# Patient Record
Sex: Male | Born: 1985 | Race: Black or African American | Hispanic: No | Marital: Single | Smoking: Current every day smoker
Health system: Southern US, Community
[De-identification: ages and names within clinical notes are randomized; demographics above are authoritative.]

## PROBLEM LIST (undated history)

## (undated) DIAGNOSIS — J45909 Unspecified asthma, uncomplicated: Secondary | ICD-10-CM

## (undated) DIAGNOSIS — W3400XA Accidental discharge from unspecified firearms or gun, initial encounter: Secondary | ICD-10-CM

## (undated) HISTORY — PX: OTHER SURGICAL HISTORY: SHX169

## (undated) HISTORY — PX: ABDOMINAL SURGERY: SHX537

---

## 2014-12-09 DIAGNOSIS — R1110 Vomiting, unspecified: Secondary | ICD-10-CM | POA: Diagnosis not present

## 2014-12-09 DIAGNOSIS — K029 Dental caries, unspecified: Secondary | ICD-10-CM | POA: Diagnosis not present

## 2015-01-22 DIAGNOSIS — K0510 Chronic gingivitis, plaque induced: Secondary | ICD-10-CM | POA: Diagnosis not present

## 2015-01-22 DIAGNOSIS — K088 Other specified disorders of teeth and supporting structures: Secondary | ICD-10-CM | POA: Diagnosis not present

## 2015-01-22 DIAGNOSIS — Z717 Human immunodeficiency virus [HIV] counseling: Secondary | ICD-10-CM | POA: Diagnosis not present

## 2015-04-11 DIAGNOSIS — Z202 Contact with and (suspected) exposure to infections with a predominantly sexual mode of transmission: Secondary | ICD-10-CM | POA: Diagnosis not present

## 2015-04-11 DIAGNOSIS — L209 Atopic dermatitis, unspecified: Secondary | ICD-10-CM | POA: Diagnosis not present

## 2015-04-11 DIAGNOSIS — Z113 Encounter for screening for infections with a predominantly sexual mode of transmission: Secondary | ICD-10-CM | POA: Diagnosis not present

## 2015-06-03 DIAGNOSIS — J45901 Unspecified asthma with (acute) exacerbation: Secondary | ICD-10-CM | POA: Diagnosis not present

## 2015-09-13 DIAGNOSIS — F17210 Nicotine dependence, cigarettes, uncomplicated: Secondary | ICD-10-CM | POA: Diagnosis not present

## 2015-09-13 DIAGNOSIS — J45909 Unspecified asthma, uncomplicated: Secondary | ICD-10-CM | POA: Diagnosis not present

## 2015-09-13 DIAGNOSIS — J40 Bronchitis, not specified as acute or chronic: Secondary | ICD-10-CM | POA: Diagnosis not present

## 2015-10-07 DIAGNOSIS — R0989 Other specified symptoms and signs involving the circulatory and respiratory systems: Secondary | ICD-10-CM | POA: Diagnosis not present

## 2015-10-07 DIAGNOSIS — M4806 Spinal stenosis, lumbar region: Secondary | ICD-10-CM | POA: Diagnosis not present

## 2015-10-07 DIAGNOSIS — G451 Carotid artery syndrome (hemispheric): Secondary | ICD-10-CM | POA: Diagnosis not present

## 2015-10-07 DIAGNOSIS — Z79899 Other long term (current) drug therapy: Secondary | ICD-10-CM | POA: Diagnosis not present

## 2015-10-07 DIAGNOSIS — H81319 Aural vertigo, unspecified ear: Secondary | ICD-10-CM | POA: Diagnosis not present

## 2015-10-07 DIAGNOSIS — R0789 Other chest pain: Secondary | ICD-10-CM | POA: Diagnosis not present

## 2015-10-07 DIAGNOSIS — I739 Peripheral vascular disease, unspecified: Secondary | ICD-10-CM | POA: Diagnosis not present

## 2015-10-07 DIAGNOSIS — R1013 Epigastric pain: Secondary | ICD-10-CM | POA: Diagnosis not present

## 2015-10-07 DIAGNOSIS — R109 Unspecified abdominal pain: Secondary | ICD-10-CM | POA: Diagnosis not present

## 2015-10-07 DIAGNOSIS — R75 Inconclusive laboratory evidence of human immunodeficiency virus [HIV]: Secondary | ICD-10-CM | POA: Diagnosis not present

## 2015-12-20 DIAGNOSIS — R05 Cough: Secondary | ICD-10-CM | POA: Diagnosis not present

## 2015-12-20 DIAGNOSIS — Z885 Allergy status to narcotic agent status: Secondary | ICD-10-CM | POA: Diagnosis not present

## 2015-12-20 DIAGNOSIS — R509 Fever, unspecified: Secondary | ICD-10-CM | POA: Diagnosis not present

## 2015-12-20 DIAGNOSIS — J069 Acute upper respiratory infection, unspecified: Secondary | ICD-10-CM | POA: Diagnosis not present

## 2015-12-20 DIAGNOSIS — Z888 Allergy status to other drugs, medicaments and biological substances status: Secondary | ICD-10-CM | POA: Diagnosis not present

## 2015-12-20 DIAGNOSIS — J45901 Unspecified asthma with (acute) exacerbation: Secondary | ICD-10-CM | POA: Diagnosis not present

## 2016-05-22 DIAGNOSIS — G8929 Other chronic pain: Secondary | ICD-10-CM | POA: Diagnosis not present

## 2016-05-22 DIAGNOSIS — M779 Enthesopathy, unspecified: Secondary | ICD-10-CM | POA: Diagnosis not present

## 2016-05-22 DIAGNOSIS — M549 Dorsalgia, unspecified: Secondary | ICD-10-CM | POA: Diagnosis not present

## 2016-05-22 DIAGNOSIS — J45909 Unspecified asthma, uncomplicated: Secondary | ICD-10-CM | POA: Diagnosis not present

## 2016-05-22 DIAGNOSIS — F17210 Nicotine dependence, cigarettes, uncomplicated: Secondary | ICD-10-CM | POA: Diagnosis not present

## 2016-06-18 DIAGNOSIS — L851 Acquired keratosis [keratoderma] palmaris et plantaris: Secondary | ICD-10-CM | POA: Diagnosis not present

## 2016-06-18 DIAGNOSIS — M79604 Pain in right leg: Secondary | ICD-10-CM | POA: Diagnosis not present

## 2016-06-18 DIAGNOSIS — B353 Tinea pedis: Secondary | ICD-10-CM | POA: Diagnosis not present

## 2016-06-18 DIAGNOSIS — H01014 Ulcerative blepharitis left upper eyelid: Secondary | ICD-10-CM | POA: Diagnosis not present

## 2016-06-18 DIAGNOSIS — M79605 Pain in left leg: Secondary | ICD-10-CM | POA: Diagnosis not present

## 2016-06-19 DIAGNOSIS — H01014 Ulcerative blepharitis left upper eyelid: Secondary | ICD-10-CM | POA: Diagnosis not present

## 2016-07-14 DIAGNOSIS — M25531 Pain in right wrist: Secondary | ICD-10-CM | POA: Diagnosis not present

## 2016-07-14 DIAGNOSIS — Y9289 Other specified places as the place of occurrence of the external cause: Secondary | ICD-10-CM | POA: Diagnosis not present

## 2016-07-14 DIAGNOSIS — S638X1A Sprain of other part of right wrist and hand, initial encounter: Secondary | ICD-10-CM | POA: Diagnosis not present

## 2016-08-28 ENCOUNTER — Ambulatory Visit (HOSPITAL_COMMUNITY)
Admission: EM | Admit: 2016-08-28 | Discharge: 2016-08-28 | Disposition: A | Discharge status: Home / Self Care | Payer: Medicare Other | Attending: Family Medicine | Admitting: Family Medicine

## 2016-08-28 ENCOUNTER — Encounter (HOSPITAL_COMMUNITY): Payer: Self-pay | Admitting: Family Medicine

## 2016-08-28 DIAGNOSIS — J4520 Mild intermittent asthma, uncomplicated: Secondary | ICD-10-CM

## 2016-08-28 DIAGNOSIS — M542 Cervicalgia: Secondary | ICD-10-CM

## 2016-08-28 DIAGNOSIS — M898X1 Other specified disorders of bone, shoulder: Secondary | ICD-10-CM | POA: Diagnosis not present

## 2016-08-28 MED ORDER — CYCLOBENZAPRINE HCL 10 MG PO TABS: 10.0000 mg | ORAL_TABLET | Freq: Two times a day (BID) | ORAL | 0 refills | Status: DC | PRN

## 2016-08-28 MED ORDER — ALBUTEROL SULFATE HFA 108 (90 BASE) MCG/ACT IN AERS: 2.0000 | INHALATION_SPRAY | RESPIRATORY_TRACT | 0 refills | Status: DC | PRN

## 2016-08-28 MED ORDER — NAPROXEN 500 MG PO TABS: 500.0000 mg | ORAL_TABLET | Freq: Two times a day (BID) | ORAL | 0 refills | Status: DC

## 2016-08-28 NOTE — ED Provider Notes (Signed)
CSN: [id number]     Arrival date & time 08/28/16  1719 History   First MD Initiated Contact with Patient 08/28/16 1831     Chief Complaint  Patient presents with  . Back Pain  . Neck Pain   (Consider location/radiation/quality/duration/timing/severity/associated sxs/prior Treatment) Patient c/o left neck and upper back discomfort. He is having asthma sx's and wants an asthma pump.  He would like to quit smoking and is asking for chantix.   The history is provided by the patient.  Back Pain  Location:  Thoracic spine Quality:  Aching Radiates to:  L shoulder Pain severity:  Moderate Pain is:  Same all the time Onset quality:  Sudden Duration:  2 days Timing:  Constant Progression:  Worsening Chronicity:  New Relieved by:  Nothing Ineffective treatments:  None tried Neck Pain    History reviewed. No pertinent past medical history. History reviewed. No pertinent surgical history. History reviewed. No pertinent family history. Social History  Substance Use Topics  . Smoking status: Never Smoker  . Smokeless tobacco: Never Used  . Alcohol use Not on file    Review of Systems  Constitutional: Negative.   HENT: Negative.   Eyes: Negative.   Respiratory: Negative.   Cardiovascular: Negative.   Gastrointestinal: Negative.   Endocrine: Negative.   Genitourinary: Negative.   Musculoskeletal: Positive for back pain and neck pain.  Skin: Negative.   Allergic/Immunologic: Negative.   Neurological: Negative.   Hematological: Negative.   Psychiatric/Behavioral: Negative.     Allergies  Lyrica [pregabalin]  Home Medications   Prior to Admission medications   Medication Sig Start Date End Date Taking? Authorizing Provider  albuterol (PROVENTIL HFA;VENTOLIN HFA) 108 (90 Base) MCG/ACT inhaler Inhale 2 puffs into the lungs every 4 (four) hours as needed for wheezing or shortness of breath. 08/28/16   Deatra CanterWilliam J Oxford, FNP  cyclobenzaprine (FLEXERIL) 10 MG tablet Take 1  tablet (10 mg total) by mouth 2 (two) times daily as needed for muscle spasms. 08/28/16   Deatra CanterWilliam J Oxford, FNP  naproxen (NAPROSYN) 500 MG tablet Take 1 tablet (500 mg total) by mouth 2 (two) times daily with a meal. 08/28/16   Deatra CanterWilliam J Oxford, FNP   Meds Ordered and Administered this Visit  Medications - No data to display  BP 127/87   Pulse 79   Temp 98.5 F (36.9 C)   Resp 18   SpO2 100%  No data found.   Physical Exam  Constitutional: He appears well-developed and well-nourished.  HENT:  Head: Normocephalic and atraumatic.  Eyes: Conjunctivae and EOM are normal. Pupils are equal, round, and reactive to light.  Neck: Normal range of motion. Neck supple.  Cardiovascular: Normal rate, regular rhythm and normal heart sounds.   Pulmonary/Chest: Effort normal and breath sounds normal.  Abdominal: Soft. Bowel sounds are normal.  Musculoskeletal: He exhibits tenderness.  Tender scapula and thoracic paraspinous muscle  Nursing note and vitals reviewed.   Urgent Care Course   Clinical Course     Procedures (including critical care time)  Labs Review Labs Reviewed - No data to display  Imaging Review No results found.   Visual Acuity Review  Right Eye Distance:   Left Eye Distance:   Bilateral Distance:    Right Eye Near:   Left Eye Near:    Bilateral Near:         MDM   1. Pain of left scapula   2. Neck pain   3. Mild intermittent asthma without complication  Albuterol MDI Naprosyn 500mg  one po bid x 10d Flexeril 10mg  one po bid prn #20     Deatra CanterWilliam J Oxford, FNP 08/28/16 1900
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
## 2016-11-06 ENCOUNTER — Encounter (HOSPITAL_COMMUNITY): Payer: Self-pay | Admitting: Emergency Medicine

## 2016-11-06 ENCOUNTER — Emergency Department (HOSPITAL_COMMUNITY)
Admission: EM | Admit: 2016-11-06 | Discharge: 2016-11-06 | Disposition: A | Discharge status: Home / Self Care | Payer: Medicare Other | Attending: Emergency Medicine | Admitting: Emergency Medicine

## 2016-11-06 ENCOUNTER — Emergency Department (HOSPITAL_COMMUNITY): Payer: Medicare Other

## 2016-11-06 DIAGNOSIS — R0789 Other chest pain: Secondary | ICD-10-CM | POA: Insufficient documentation

## 2016-11-06 DIAGNOSIS — F17200 Nicotine dependence, unspecified, uncomplicated: Secondary | ICD-10-CM | POA: Diagnosis not present

## 2016-11-06 DIAGNOSIS — R079 Chest pain, unspecified: Secondary | ICD-10-CM | POA: Diagnosis not present

## 2016-11-06 LAB — CBC
HEMATOCRIT: 47.3 % (ref 39.0–52.0)
HEMOGLOBIN: 15.9 g/dL (ref 13.0–17.0)
MCH: 29.7 pg (ref 26.0–34.0)
MCHC: 33.6 g/dL (ref 30.0–36.0)
MCV: 88.4 fL (ref 78.0–100.0)
PLATELETS: 260 10*3/uL (ref 150–400)
RBC: 5.35 MIL/uL (ref 4.22–5.81)
RDW: 13.1 % (ref 11.5–15.5)
WBC: 8.8 10*3/uL (ref 4.0–10.5)

## 2016-11-06 LAB — I-STAT TROPONIN, ED: Troponin i, poc: 0 ng/mL (ref 0.00–0.08)

## 2016-11-06 LAB — BASIC METABOLIC PANEL
Anion gap: 8 (ref 5–15)
BUN: 6 mg/dL (ref 6–20)
CHLORIDE: 106 mmol/L (ref 101–111)
CO2: 27 mmol/L (ref 22–32)
CREATININE: 0.86 mg/dL (ref 0.61–1.24)
Calcium: 9.6 mg/dL (ref 8.9–10.3)
GFR calc non Af Amer: >60 mL/min (ref >60)
Glucose, Bld: 86 mg/dL (ref 65–99)
POTASSIUM: 4.2 mmol/L (ref 3.5–5.1)
SODIUM: 141 mmol/L (ref 135–145)

## 2016-11-06 MED ORDER — ALBUTEROL SULFATE HFA 108 (90 BASE) MCG/ACT IN AERS
2.0000 | INHALATION_SPRAY | RESPIRATORY_TRACT | Status: DC | PRN
  Administered 2016-11-06: 2 via RESPIRATORY_TRACT
  Filled 2016-11-06: qty 6.7

## 2016-11-06 MED ORDER — AEROCHAMBER PLUS W/MASK MISC
1.0000 | Freq: Once | Status: AC
  Administered 2016-11-06: 1
  Filled 2016-11-06: qty 1
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
## 2016-11-06 NOTE — Discharge Instructions (Signed)
Take Tylenol as directed for pain. Use your albuterol inhaler with spacer 2 puffs every 4 hours as needed for cough or shortness of breath. Contact Dr. Mayford KnifeWilliams to get a primary care physician. Ask Dr. Mayford KnifeWilliams to help you to stop smoking.

## 2016-11-06 NOTE — Care Management CC44 (Signed)
ED CM met with patient, who reports recently moving from [place] and not having a PCP. CM discuss PCP who are taking new Medicare patients. Dr. York Ram is accepting new Medicare patients. Provided office contact information, patient verbalized understanding and teach back done. No further ED CM needs identified.

## 2016-11-06 NOTE — Care Management (Signed)
CM attempted contacted patient  to inform him that the referral fro Dr. Mayford KnifeWilliams will need to changed due to practice no longer open. Provided him with Dr. Nils Pylesi  Bonsu information, since patient [facility] to [facility] [address] Will make second attempt tomorrow.

## 2016-11-06 NOTE — ED Triage Notes (Signed)
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

## 2016-11-29 ENCOUNTER — Emergency Department (HOSPITAL_COMMUNITY)
Admission: EM | Admit: 2016-11-29 | Discharge: 2016-11-29 | Disposition: A | Discharge status: Home / Self Care | Payer: Medicare Other | Attending: Emergency Medicine | Admitting: Emergency Medicine

## 2016-11-29 ENCOUNTER — Encounter (HOSPITAL_COMMUNITY): Payer: Self-pay

## 2016-11-29 ENCOUNTER — Encounter (HOSPITAL_COMMUNITY): Payer: Self-pay | Admitting: Emergency Medicine

## 2016-11-29 ENCOUNTER — Emergency Department (HOSPITAL_COMMUNITY)
Admission: EM | Admit: 2016-11-29 | Discharge: 2016-11-29 | Disposition: A | Discharge status: Home / Self Care | Payer: Medicare Other | Source: Home / Self Care

## 2016-11-29 DIAGNOSIS — J020 Streptococcal pharyngitis: Secondary | ICD-10-CM

## 2016-11-29 DIAGNOSIS — Z79899 Other long term (current) drug therapy: Secondary | ICD-10-CM | POA: Diagnosis not present

## 2016-11-29 DIAGNOSIS — F17210 Nicotine dependence, cigarettes, uncomplicated: Secondary | ICD-10-CM

## 2016-11-29 DIAGNOSIS — Z5321 Procedure and treatment not carried out due to patient leaving prior to being seen by health care provider: Secondary | ICD-10-CM

## 2016-11-29 DIAGNOSIS — R52 Pain, unspecified: Secondary | ICD-10-CM

## 2016-11-29 DIAGNOSIS — R05 Cough: Secondary | ICD-10-CM

## 2016-11-29 DIAGNOSIS — J029 Acute pharyngitis, unspecified: Secondary | ICD-10-CM | POA: Diagnosis present

## 2016-11-29 LAB — RAPID STREP SCREEN (MED CTR MEBANE ONLY): Streptococcus, Group A Screen (Direct): POSITIVE — AB

## 2016-11-29 MED ORDER — PENICILLIN G BENZATHINE 1200000 UNIT/2ML IM SUSP
1.2000 10*6.[IU] | Freq: Once | INTRAMUSCULAR | Status: AC
  Administered 2016-11-29: 1.2 10*6.[IU] via INTRAMUSCULAR
  Filled 2016-11-29: qty 2

## 2016-11-29 MED ORDER — KETOROLAC TROMETHAMINE 60 MG/2ML IM SOLN
60.0000 mg | Freq: Once | INTRAMUSCULAR | Status: AC
  Administered 2016-11-29: 60 mg via INTRAMUSCULAR
  Filled 2016-11-29: qty 2
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
## 2016-11-29 NOTE — ED Triage Notes (Signed)
Pt to ED from home c/o flu-like symptoms - sore throat, generalized body aches, chills, runny nose, and productive cough since yesterday morning. States he has tried NyQuil without relief. Resp e/u.

## 2016-11-29 NOTE — ED Provider Notes (Signed)
[facility]PT Provider Note   CSN: [id number] Arrival date & time: 11/29/16  0848  By signing my name below, I, Majel Homer, attest that this documentation has been prepared under the direction and in the presence of Josh Nakiya Rallis PA-C . Electronically Signed: Majel Homer, Scribe. 11/29/2016. 9:55 AM.  History   Chief Complaint Chief Complaint  Patient presents with  . Sore Throat   The history is provided by the patient. No language interpreter was used.   HPI Comments: Colin Hammond is a 31 y.o. male who presents to the Emergency Department complaining of gradually worsening, sore throat that began ~2 days ago. Pt reports associated pain when swallowing, cough productive of "thick, yellow" mucous, subjective fever, and chills. He denies any other complaints.   No past medical history on file.  There are no active problems to display for this patient.  Past Surgical History:  Procedure Laterality Date  . ABDOMINAL SURGERY      Home Medications    Prior to Admission medications   Medication Sig Start Date End Date Taking? Authorizing Provider  albuterol (PROVENTIL HFA;VENTOLIN HFA) 108 (90 Base) MCG/ACT inhaler Inhale 2 puffs into the lungs every 4 (four) hours as needed for wheezing or shortness of breath. 08/28/16   Deatra Canter, FNP  cyclobenzaprine (FLEXERIL) 10 MG tablet Take 1 tablet (10 mg total) by mouth 2 (two) times daily as needed for muscle spasms. 08/28/16   Deatra Canter, FNP  naproxen (NAPROSYN) 500 MG tablet Take 1 tablet (500 mg total) by mouth 2 (two) times daily with a meal. 08/28/16   Deatra Canter, FNP    Family History No family history on file.  Social History Social History  Substance Use Topics  . Smoking status: Current Every Day Smoker    Packs/day: 0.50    Types: Cigarettes  . Smokeless tobacco: Never Used  . Alcohol use Not on file   Allergies   Kadian [morphine sulfate er] and Lyrica [pregabalin]  Review of Systems Review of  Systems  Constitutional: Positive for chills and fever. Negative for fatigue.  HENT: Positive for sore throat and trouble swallowing. Negative for congestion, ear pain, rhinorrhea and sinus pressure.   Eyes: Negative for redness.  Respiratory: Positive for cough. Negative for wheezing.   Gastrointestinal: Negative for abdominal pain, diarrhea, nausea and vomiting.  Genitourinary: Negative for dysuria.  Musculoskeletal: Negative for myalgias and neck stiffness.  Skin: Negative for rash.  Neurological: Negative for headaches.  Hematological: Positive for adenopathy.   Physical Exam Updated Vital Signs BP 131/82 (BP Location: Right Arm)   Pulse 85   Temp 98.4 F (36.9 C) (Oral)   Resp 16   Ht 6\' 1"  (1.854 m)   Wt 185 lb (83.9 kg)   SpO2 100%   BMI 24.41 kg/m   Physical Exam  Constitutional: He is oriented to person, place, and time. He appears well-developed and well-nourished.  HENT:  Head: Normocephalic and atraumatic.  Right Ear: Tympanic membrane, external ear and ear canal normal.  Left Ear: Tympanic membrane, external ear and ear canal normal.  Nose: No mucosal edema or rhinorrhea.  Mouth/Throat: Uvula is midline and mucous membranes are normal. Normal dentition. Oropharyngeal exudate, posterior oropharyngeal edema and posterior oropharyngeal erythema present. Tonsillar exudate.  Eyes: Conjunctivae and EOM are normal. Right eye exhibits no discharge. Left eye exhibits no discharge.  Neck: Normal range of motion. Neck supple.  Cardiovascular: Normal rate, regular rhythm and normal heart sounds.   Pulmonary/Chest: Effort  normal and breath sounds normal.  Abdominal: Soft. He exhibits no distension. There is no tenderness.  Musculoskeletal: Normal range of motion.  Lymphadenopathy:    He has cervical adenopathy.       Right cervical: Posterior cervical adenopathy present.       Left cervical: Posterior cervical adenopathy present.  Neurological: He is alert and oriented to  person, place, and time.  Skin: Skin is warm and dry.  Psychiatric: He has a normal mood and affect.  Nursing note and vitals reviewed.  ED Treatments / Results  DIAGNOSTIC STUDIES:  Oxygen Saturation is 100% on RA, normal by my interpretation.    COORDINATION OF CARE:  9:52 AM Discussed treatment plan with pt at bedside and pt agreed to plan.  Labs (all labs ordered are listed, but only abnormal results are displayed) Labs Reviewed  RAPID STREP SCREEN (NOT AT [facility]) - Abnormal; Notable for the following:       Result Value   Streptococcus, Group A Screen (Direct) POSITIVE (*)    All other components within normal limits   EKG  EKG Interpretation None       Radiology No results found.  Procedures Procedures (including critical care time)  Medications Ordered in ED Medications - No data to display  Initial Impression / Assessment and Plan / ED Course  I have reviewed the triage vital signs and the nursing notes.  Pertinent labs & imaging results that were available during my care of the patient were reviewed by me and considered in my medical decision making (see chart for details).     Patient seen and examined. Work-up initiated. Medications ordered.   Vital signs reviewed and are as follows: BP 135/88 (BP Location: Left Arm)   Pulse 75   Temp 98.8 F (37.1 C) (Oral)   Resp 16   Ht 6\' 1"  (1.854 m)   Wt 83.9 kg   SpO2 98%   BMI 24.41 kg/m    Patient informed of positive strep results. IM Toradol and Bicillin given. Counseled on conservative measures.   I personally performed the services described in this documentation, which was scribed in my presence. The recorded information has been reviewed and is accurate.   Final Clinical Impressions(s) / ED Diagnoses   Final diagnoses:  Strep pharyngitis   Strep positive. No abscess noted. Patient tolerating secretions. Treatment as well.    New Prescriptions New Prescriptions   No medications on file      Renne CriglerJoshua Averee Harb, PA-C 11/29/16 1019    Maia PlanJoshua G Long, MD 11/30/16 [phone]

## 2016-11-29 NOTE — Discharge Instructions (Signed)
Please read and follow all provided instructions.  Your diagnoses today include:  1. Strep pharyngitis     Tests performed today include:  Strep test: was POSITIVE for strep throat  Vital signs. See below for your results today.   Medications prescribed:  You were given a one-time shot of penicillin to treat your strep throat.   Take any medications prescribed only as directed.   Home care instructions:  Please read the educational materials provided and follow any instructions contained in this packet.  Follow-up instructions: Please follow-up with your primary care provider as needed for further evaluation of your symptoms.  Return instructions:   Please return to the Emergency Department if you experience worsening symptoms.   Return if you have worsening problems swallowing, your neck becomes swollen, you cannot swallow your saliva or your voice becomes muffled.   Return with high persistent fever, persistent vomiting, or if you have trouble breathing.   Please return if you have any other emergent concerns.  Additional Information:  Your vital signs today were: BP 131/82 (BP Location: Right Arm)    Pulse 85    Temp 99.1 F (37.3 C) (Oral)    Resp 16    Ht 6\' 1"  (1.854 m)    Wt 83.9 kg    SpO2 100%    BMI 24.41 kg/m  If your blood pressure (BP) was elevated above 135/85 this visit, please have this repeated by your doctor within one month. --------------

## 2016-11-29 NOTE — ED Notes (Signed)
Patient comes in with sore throat and swollen tonsils that started yesterday. Patient states he has had strep throat several times in the past.

## 2016-11-29 NOTE — ED Notes (Signed)
Called to take to treatment room no answer

## 2016-11-29 NOTE — ED Triage Notes (Signed)
Per Pt, Pt is coming from home with complaints of cough, sore throat, and generalized body aches that started yesterday. Reports productive cough with yellow, white thick mucous. Denies fevers.

## 2016-12-10 ENCOUNTER — Emergency Department (HOSPITAL_COMMUNITY)
Admission: EM | Admit: 2016-12-10 | Discharge: 2016-12-10 | Disposition: A | Discharge status: Home / Self Care | Payer: Medicare Other | Attending: Emergency Medicine | Admitting: Emergency Medicine

## 2016-12-10 ENCOUNTER — Encounter (HOSPITAL_COMMUNITY): Payer: Self-pay

## 2016-12-10 DIAGNOSIS — F17210 Nicotine dependence, cigarettes, uncomplicated: Secondary | ICD-10-CM | POA: Diagnosis not present

## 2016-12-10 DIAGNOSIS — M533 Sacrococcygeal disorders, not elsewhere classified: Secondary | ICD-10-CM | POA: Insufficient documentation

## 2016-12-10 MED ORDER — IBUPROFEN 800 MG PO TABS: 800.0000 mg | ORAL_TABLET | Freq: Three times a day (TID) | ORAL | 0 refills | Status: DC

## 2016-12-10 MED ORDER — TRAMADOL HCL 50 MG PO TABS: 50.0000 mg | ORAL_TABLET | Freq: Four times a day (QID) | ORAL | 0 refills | Status: DC | PRN

## 2016-12-10 MED ORDER — KETOROLAC TROMETHAMINE 60 MG/2ML IM SOLN
60.0000 mg | Freq: Once | INTRAMUSCULAR | Status: AC
  Administered 2016-12-10: 60 mg via INTRAMUSCULAR
  Filled 2016-12-10: qty 2

## 2016-12-10 NOTE — ED Provider Notes (Signed)
[facility]-EMERGENCY DEPT Provider Note   CSN: [id number] Arrival date & time: 12/10/16  0138  By signing my name below, I, Elder NegusRussell Johnston, attest that this documentation has been prepared under the direction and in the presence of Gilda Creasehristopher J Ruven Corradi, MD. Electronically Signed: Elder Negusussell Johnston, Scribe. 12/10/16. 2:21 AM.   History   Chief Complaint Chief Complaint  Patient presents with  . Tailbone Pain    HPI Colin Hammond is a 31 y.o. male without any chronic medical problems who presents to the ED for evaluation of back pain. This patient states that in the last 48 hours he has experienced worsening pain over the lower midline back at the sacral/gluteal cleft area. He denies any particular trauma, but does state that he works a job with repetitive lifting. No fevers. No decreased sensation or focal weaknesses.   The history is provided by the patient. No language interpreter was used.  Back Pain   This is a new problem. The current episode started 2 days ago. The problem occurs constantly. The problem has been gradually worsening. The pain is associated with no known injury. Pain location: coccyx. The pain is mild. Pertinent negatives include no numbness and no weakness.    History reviewed. No pertinent past medical history.  There are no active problems to display for this patient.   Past Surgical History:  Procedure Laterality Date  . ABDOMINAL SURGERY         Home Medications    Prior to Admission medications   Medication Sig Start Date End Date Taking? Authorizing Provider  albuterol (PROVENTIL HFA;VENTOLIN HFA) 108 (90 Base) MCG/ACT inhaler Inhale 2 puffs into the lungs every 4 (four) hours as needed for wheezing or shortness of breath. 08/28/16   Deatra CanterWilliam J Oxford, FNP  cyclobenzaprine (FLEXERIL) 10 MG tablet Take 1 tablet (10 mg total) by mouth 2 (two) times daily as needed for muscle spasms. 08/28/16   Deatra CanterWilliam J Oxford, FNP  naproxen (NAPROSYN) 500 MG tablet Take  1 tablet (500 mg total) by mouth 2 (two) times daily with a meal. 08/28/16   Deatra CanterWilliam J Oxford, FNP    Family History History reviewed. No pertinent family history.  Social History Social History  Substance Use Topics  . Smoking status: Current Every Day Smoker    Packs/day: 0.50    Types: Cigarettes  . Smokeless tobacco: Never Used  . Alcohol use Not on file     Allergies   Kadian [morphine sulfate er] and Lyrica [pregabalin]   Review of Systems Review of Systems  Musculoskeletal: Positive for back pain.  Neurological: Negative for weakness and numbness.  All other systems reviewed and are negative.    Physical Exam Updated Vital Signs BP (!) 153/93   Pulse 87   Temp 97.9 F (36.6 C) (Oral)   Resp 15   Ht 6\' 1"  (1.854 m)   Wt 192 lb 9 oz (87.3 kg)   SpO2 98%   BMI 25.41 kg/m   Physical Exam  Constitutional: He is oriented to person, place, and time. He appears well-developed and well-nourished. No distress.  HENT:  Head: Normocephalic and atraumatic.  Right Ear: Hearing normal.  Left Ear: Hearing normal.  Nose: Nose normal.  Mouth/Throat: Oropharynx is clear and moist and mucous membranes are normal.  Eyes: Conjunctivae and EOM are normal. Pupils are equal, round, and reactive to light.  Neck: Normal range of motion. Neck supple.  Cardiovascular: Regular rhythm, S1 normal and S2 normal.  Exam reveals no gallop and  no friction rub.   No murmur heard. Pulmonary/Chest: Effort normal and breath sounds normal. No respiratory distress. He exhibits no tenderness.  Abdominal: Soft. Normal appearance and bowel sounds are normal. There is no hepatosplenomegaly. There is no tenderness. There is no rebound, no guarding, no tenderness at McBurney's point and negative Murphy's sign. No hernia.  Musculoskeletal: Normal range of motion.  Point tenderness over the proximal R coccyx area. No overlying skin changes.   Neurological: He is alert and oriented to person, place, and  time. He has normal strength. No cranial nerve deficit or sensory deficit. Coordination normal. GCS eye subscore is 4. GCS verbal subscore is 5. GCS motor subscore is 6.  Skin: Skin is warm, dry and intact. No rash noted. No cyanosis.  Psychiatric: He has a normal mood and affect. His speech is normal and behavior is normal. Thought content normal.  Nursing note and vitals reviewed.    ED Treatments / Results  Labs (all labs ordered are listed, but only abnormal results are displayed) Labs Reviewed - No data to display  EKG  EKG Interpretation None       Radiology No results found.  Procedures Procedures (including critical care time)  Medications Ordered in ED Medications  ketorolac (TORADOL) injection 60 mg (not administered)     Initial Impression / Assessment and Plan / ED Course  I have reviewed the triage vital signs and the nursing notes.  Pertinent labs & imaging results that were available during my care of the patient were reviewed by me and considered in my medical decision making (see chart for details).     Patient presents to the emergency department with complaints of nontraumatic tailbone pain. Patient does have point tenderness at the proximal aspect of his tailbone on the right side. There is, however, no overlying skin change. Nothing to suggest pilonidal abscess or buttock abscess based on examination. Patient does not have any radiation to the lower extremities, no concern for lumbar abnormality or radiculopathy.  Final Clinical Impressions(s) / ED Diagnoses   Final diagnoses:  Coccyx pain    New Prescriptions New Prescriptions   No medications on file  I personally performed the services described in this documentation, which was scribed in my presence. The recorded information has been reviewed and is accurate.    Gilda Crease, MD 12/10/16 [phone]

## 2016-12-10 NOTE — ED Triage Notes (Signed)
Pt complaining of tailbone pain. Pt denies any injury/trauma. Pt denies any numbness/tingling in legs or perineum. Pt denies any incontinence. Pt a/o x 4, ambulatory at triage.

## 2016-12-20 ENCOUNTER — Emergency Department (HOSPITAL_COMMUNITY)
Admission: EM | Admit: 2016-12-20 | Discharge: 2016-12-21 | Disposition: A | Discharge status: Home / Self Care | Payer: Medicare Other | Attending: Emergency Medicine | Admitting: Emergency Medicine

## 2016-12-20 ENCOUNTER — Encounter (HOSPITAL_COMMUNITY): Payer: Self-pay

## 2016-12-20 DIAGNOSIS — B9789 Other viral agents as the cause of diseases classified elsewhere: Secondary | ICD-10-CM

## 2016-12-20 DIAGNOSIS — F17210 Nicotine dependence, cigarettes, uncomplicated: Secondary | ICD-10-CM | POA: Diagnosis not present

## 2016-12-20 DIAGNOSIS — R079 Chest pain, unspecified: Secondary | ICD-10-CM | POA: Diagnosis not present

## 2016-12-20 DIAGNOSIS — R05 Cough: Secondary | ICD-10-CM | POA: Diagnosis not present

## 2016-12-20 DIAGNOSIS — J069 Acute upper respiratory infection, unspecified: Secondary | ICD-10-CM

## 2016-12-20 NOTE — ED Triage Notes (Signed)
Pt c/o cough and scratchy throat starting yesterday. Endorses yellow sputum. A&Ox4. Ambulatory. No respiratory distress noted.

## 2016-12-21 ENCOUNTER — Emergency Department (HOSPITAL_COMMUNITY): Payer: Medicare Other

## 2016-12-21 DIAGNOSIS — R05 Cough: Secondary | ICD-10-CM | POA: Diagnosis not present

## 2016-12-21 DIAGNOSIS — R079 Chest pain, unspecified: Secondary | ICD-10-CM | POA: Diagnosis not present

## 2016-12-21 MED ORDER — ALBUTEROL SULFATE HFA 108 (90 BASE) MCG/ACT IN AERS
2.0000 | INHALATION_SPRAY | Freq: Once | RESPIRATORY_TRACT | Status: AC
  Administered 2016-12-21: 2 via RESPIRATORY_TRACT
  Filled 2016-12-21: qty 6.7

## 2016-12-21 NOTE — ED Notes (Signed)
While discharging pt, he reported that he needs further treatment because he is unable to sleep. Advised pt that the doctor is diagnosing him with a viral respiratory infection and he does not need any further medications and can keep taking the over the counter medication and use the inhaler that we gave him today. Pt reported he was going to another hospital.
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
## 2016-12-21 NOTE — ED Notes (Signed)
Patient transported to X-ray 

## 2016-12-21 NOTE — Discharge Instructions (Signed)
See your Physician for recheck.  Use inhaler as directed.
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
## 2017-04-02 ENCOUNTER — Encounter (HOSPITAL_COMMUNITY): Payer: Self-pay | Admitting: Emergency Medicine

## 2017-04-02 ENCOUNTER — Emergency Department (HOSPITAL_COMMUNITY)
Admission: EM | Admit: 2017-04-02 | Discharge: 2017-04-02 | Disposition: A | Discharge status: Home / Self Care | Payer: Medicare Other | Attending: Emergency Medicine | Admitting: Emergency Medicine

## 2017-04-02 DIAGNOSIS — F17210 Nicotine dependence, cigarettes, uncomplicated: Secondary | ICD-10-CM | POA: Insufficient documentation

## 2017-04-02 DIAGNOSIS — Z79899 Other long term (current) drug therapy: Secondary | ICD-10-CM | POA: Insufficient documentation

## 2017-04-02 DIAGNOSIS — J029 Acute pharyngitis, unspecified: Secondary | ICD-10-CM | POA: Insufficient documentation

## 2017-04-02 HISTORY — DX: Accidental discharge from unspecified firearms or gun, initial encounter: W34.00XA

## 2017-04-02 MED ORDER — AMOXICILLIN 500 MG PO CAPS: 500.0000 mg | ORAL_CAPSULE | Freq: Three times a day (TID) | ORAL | 0 refills | Status: DC

## 2017-04-02 MED ORDER — IBUPROFEN 600 MG PO TABS: 600.0000 mg | ORAL_TABLET | Freq: Four times a day (QID) | ORAL | 0 refills | Status: DC | PRN

## 2017-04-02 MED ORDER — AMOXICILLIN 500 MG PO CAPS
500.0000 mg | ORAL_CAPSULE | Freq: Once | ORAL | Status: AC
  Administered 2017-04-02: 500 mg via ORAL
  Filled 2017-04-02: qty 1

## 2017-04-02 MED ORDER — IBUPROFEN 800 MG PO TABS
800.0000 mg | ORAL_TABLET | Freq: Once | ORAL | Status: AC
  Administered 2017-04-02: 800 mg via ORAL
  Filled 2017-04-02: qty 1
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
## 2017-04-02 NOTE — ED Notes (Signed)
Patient is alert and oriented x3.  He was given DC instructions and follow up visit instructions.  Patient gave verbal understanding.  He was DC ambulatory under his own power to home.  V/S stable.  He was not showing any signs of distress on DC 

## 2017-04-02 NOTE — ED Triage Notes (Signed)
Pt states his neck is tender with palpation around the glands on the left side  Denies sore throat

## 2017-04-29 DIAGNOSIS — Z5181 Encounter for therapeutic drug level monitoring: Secondary | ICD-10-CM | POA: Diagnosis not present

## 2017-05-02 DIAGNOSIS — Z5181 Encounter for therapeutic drug level monitoring: Secondary | ICD-10-CM | POA: Diagnosis not present

## 2017-05-13 DIAGNOSIS — Z5181 Encounter for therapeutic drug level monitoring: Secondary | ICD-10-CM | POA: Diagnosis not present

## 2017-05-16 DIAGNOSIS — Z5181 Encounter for therapeutic drug level monitoring: Secondary | ICD-10-CM | POA: Diagnosis not present

## 2017-05-20 DIAGNOSIS — Z5181 Encounter for therapeutic drug level monitoring: Secondary | ICD-10-CM | POA: Diagnosis not present

## 2017-05-22 DIAGNOSIS — Z5181 Encounter for therapeutic drug level monitoring: Secondary | ICD-10-CM | POA: Diagnosis not present

## 2017-05-28 DIAGNOSIS — Z5181 Encounter for therapeutic drug level monitoring: Secondary | ICD-10-CM | POA: Diagnosis not present

## 2017-05-30 DIAGNOSIS — Z5181 Encounter for therapeutic drug level monitoring: Secondary | ICD-10-CM | POA: Diagnosis not present

## 2017-06-12 DIAGNOSIS — Z5181 Encounter for therapeutic drug level monitoring: Secondary | ICD-10-CM | POA: Diagnosis not present

## 2017-06-26 ENCOUNTER — Emergency Department (HOSPITAL_COMMUNITY)
Admission: EM | Admit: 2017-06-26 | Discharge: 2017-06-26 | Disposition: A | Discharge status: Home / Self Care | Payer: Medicare Other | Attending: Emergency Medicine | Admitting: Emergency Medicine

## 2017-06-26 ENCOUNTER — Encounter (HOSPITAL_COMMUNITY): Payer: Self-pay

## 2017-06-26 ENCOUNTER — Emergency Department (HOSPITAL_COMMUNITY): Payer: Medicare Other

## 2017-06-26 DIAGNOSIS — R197 Diarrhea, unspecified: Secondary | ICD-10-CM | POA: Diagnosis not present

## 2017-06-26 DIAGNOSIS — F17210 Nicotine dependence, cigarettes, uncomplicated: Secondary | ICD-10-CM | POA: Diagnosis not present

## 2017-06-26 DIAGNOSIS — R112 Nausea with vomiting, unspecified: Secondary | ICD-10-CM | POA: Insufficient documentation

## 2017-06-26 DIAGNOSIS — Z79899 Other long term (current) drug therapy: Secondary | ICD-10-CM | POA: Insufficient documentation

## 2017-06-26 DIAGNOSIS — K5730 Diverticulosis of large intestine without perforation or abscess without bleeding: Secondary | ICD-10-CM | POA: Diagnosis not present

## 2017-06-26 DIAGNOSIS — R109 Unspecified abdominal pain: Secondary | ICD-10-CM | POA: Diagnosis present

## 2017-06-26 LAB — COMPREHENSIVE METABOLIC PANEL
ALBUMIN: 4.3 g/dL (ref 3.5–5.0)
ALT: 22 U/L (ref 17–63)
AST: 25 U/L (ref 15–41)
Alkaline Phosphatase: 48 U/L (ref 38–126)
Anion gap: 8 (ref 5–15)
BUN: 12 mg/dL (ref 6–20)
CHLORIDE: 105 mmol/L (ref 101–111)
CO2: 26 mmol/L (ref 22–32)
CREATININE: 0.88 mg/dL (ref 0.61–1.24)
Calcium: 9.6 mg/dL (ref 8.9–10.3)
GFR calc Af Amer: >60 mL/min (ref >60)
GFR calc non Af Amer: >60 mL/min (ref >60)
GLUCOSE: 95 mg/dL (ref 65–99)
POTASSIUM: 4.6 mmol/L (ref 3.5–5.1)
Sodium: 139 mmol/L (ref 135–145)
Total Bilirubin: 0.5 mg/dL (ref 0.3–1.2)
Total Protein: 7.5 g/dL (ref 6.5–8.1)

## 2017-06-26 LAB — CBC WITH DIFFERENTIAL/PLATELET
BASOS ABS: 0 10*3/uL (ref 0.0–0.1)
Basophils Relative: 0 %
EOS ABS: 0.1 10*3/uL (ref 0.0–0.7)
Eosinophils Relative: 1 %
HEMATOCRIT: 47.1 % (ref 39.0–52.0)
Hemoglobin: 16.2 g/dL (ref 13.0–17.0)
LYMPHS PCT: 33 %
Lymphs Abs: 3 10*3/uL (ref 0.7–4.0)
MCH: 29.6 pg (ref 26.0–34.0)
MCHC: 34.4 g/dL (ref 30.0–36.0)
MCV: 85.9 fL (ref 78.0–100.0)
MONOS PCT: 9 %
Monocytes Absolute: 0.8 10*3/uL (ref 0.1–1.0)
NEUTROS PCT: 57 %
Neutro Abs: 5.2 10*3/uL (ref 1.7–7.7)
PLATELETS: UNDETERMINED 10*3/uL (ref 150–400)
RBC: 5.48 MIL/uL (ref 4.22–5.81)
RDW: 13.1 % (ref 11.5–15.5)
WBC: 9.1 10*3/uL (ref 4.0–10.5)

## 2017-06-26 LAB — URINALYSIS, ROUTINE W REFLEX MICROSCOPIC
BACTERIA UA: NONE SEEN
BILIRUBIN URINE: NEGATIVE
Glucose, UA: NEGATIVE mg/dL
HGB URINE DIPSTICK: NEGATIVE
KETONES UR: NEGATIVE mg/dL
NITRITE: NEGATIVE
PH: 5 (ref 5.0–8.0)
Protein, ur: NEGATIVE mg/dL

## 2017-06-26 LAB — LIPASE, BLOOD: LIPASE: 27 U/L (ref 11–51)

## 2017-06-26 MED ORDER — DICYCLOMINE HCL 20 MG PO TABS: 20.0000 mg | ORAL_TABLET | Freq: Two times a day (BID) | ORAL | 0 refills | Status: DC

## 2017-06-26 MED ORDER — IOPAMIDOL (ISOVUE-300) INJECTION 61%
INTRAVENOUS | Status: AC
  Administered 2017-06-26: 100 mL via INTRAVENOUS
  Filled 2017-06-26: qty 100

## 2017-06-26 MED ORDER — FENTANYL CITRATE (PF) 100 MCG/2ML IJ SOLN
50.0000 ug | Freq: Once | INTRAMUSCULAR | Status: AC
  Administered 2017-06-26: 50 ug via INTRAVENOUS
  Filled 2017-06-26: qty 2

## 2017-06-26 MED ORDER — ONDANSETRON HCL 4 MG/2ML IJ SOLN
4.0000 mg | Freq: Once | INTRAMUSCULAR | Status: AC
  Administered 2017-06-26: 4 mg via INTRAVENOUS
  Filled 2017-06-26: qty 2

## 2017-06-26 MED ORDER — IOPAMIDOL (ISOVUE-300) INJECTION 61%
100.0000 mL | Freq: Once | INTRAVENOUS | Status: AC | PRN
  Administered 2017-06-26: 100 mL via INTRAVENOUS

## 2017-06-26 MED ORDER — SODIUM CHLORIDE 0.9 % IV SOLN
INTRAVENOUS | Status: DC
  Administered 2017-06-26: 08:00:00 via INTRAVENOUS

## 2017-06-26 MED ORDER — ONDANSETRON 4 MG PO TBDP: 4.0000 mg | ORAL_TABLET | Freq: Three times a day (TID) | ORAL | 0 refills | Status: DC | PRN

## 2017-06-26 NOTE — Discharge Instructions (Signed)
As discussed, your evaluation today has been largely reassuring.  But, it is important that you monitor your condition carefully, and do not hesitate to return to the ED if you develop new, or concerning changes in your condition. ? ?Otherwise, please follow-up with your physician for appropriate ongoing care. ? ?
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
## 2017-06-26 NOTE — ED Triage Notes (Signed)
Pt complains of vomiting and diarrhea for two days Pt states that he's very nauseated

## 2017-06-27 LAB — URINE CULTURE: CULTURE: NO GROWTH

## 2017-07-28 ENCOUNTER — Encounter (HOSPITAL_COMMUNITY): Payer: Self-pay | Admitting: Emergency Medicine

## 2017-07-28 DIAGNOSIS — S29019A Strain of muscle and tendon of unspecified wall of thorax, initial encounter: Secondary | ICD-10-CM | POA: Diagnosis not present

## 2017-07-28 DIAGNOSIS — Y929 Unspecified place or not applicable: Secondary | ICD-10-CM | POA: Diagnosis not present

## 2017-07-28 DIAGNOSIS — M549 Dorsalgia, unspecified: Secondary | ICD-10-CM | POA: Diagnosis not present

## 2017-07-28 DIAGNOSIS — Y939 Activity, unspecified: Secondary | ICD-10-CM | POA: Diagnosis not present

## 2017-07-28 DIAGNOSIS — F17210 Nicotine dependence, cigarettes, uncomplicated: Secondary | ICD-10-CM | POA: Diagnosis not present

## 2017-07-28 DIAGNOSIS — X509XXA Other and unspecified overexertion or strenuous movements or postures, initial encounter: Secondary | ICD-10-CM | POA: Diagnosis not present

## 2017-07-28 DIAGNOSIS — Y999 Unspecified external cause status: Secondary | ICD-10-CM | POA: Insufficient documentation

## 2017-07-28 DIAGNOSIS — S299XXA Unspecified injury of thorax, initial encounter: Secondary | ICD-10-CM | POA: Diagnosis present

## 2017-07-28 DIAGNOSIS — S29012A Strain of muscle and tendon of back wall of thorax, initial encounter: Secondary | ICD-10-CM | POA: Diagnosis not present

## 2017-07-28 NOTE — ED Triage Notes (Signed)
Pain reports left back pain radiating to left lateral torso worse when twisting or lying onset last week denies injury , respirations unlabored .

## 2017-07-29 ENCOUNTER — Emergency Department (HOSPITAL_COMMUNITY)
Admission: EM | Admit: 2017-07-29 | Discharge: 2017-07-29 | Disposition: A | Discharge status: Home / Self Care | Payer: Medicare Other | Attending: Emergency Medicine | Admitting: Emergency Medicine

## 2017-07-29 ENCOUNTER — Emergency Department (HOSPITAL_COMMUNITY): Payer: Medicare Other

## 2017-07-29 DIAGNOSIS — M549 Dorsalgia, unspecified: Secondary | ICD-10-CM | POA: Diagnosis not present

## 2017-07-29 DIAGNOSIS — S29019A Strain of muscle and tendon of unspecified wall of thorax, initial encounter: Secondary | ICD-10-CM

## 2017-07-29 MED ORDER — TRAMADOL HCL 50 MG PO TABS: 50.0000 mg | ORAL_TABLET | Freq: Four times a day (QID) | ORAL | 0 refills | Status: DC | PRN

## 2017-07-29 MED ORDER — BACLOFEN 20 MG PO TABS: 20.0000 mg | ORAL_TABLET | Freq: Three times a day (TID) | ORAL | 0 refills | Status: DC

## 2017-07-29 MED ORDER — IBUPROFEN 800 MG PO TABS: 800.0000 mg | ORAL_TABLET | Freq: Three times a day (TID) | ORAL | 0 refills | Status: DC

## 2017-07-29 MED ORDER — LIDOCAINE 5 % EX PTCH: 1.0000 | MEDICATED_PATCH | CUTANEOUS | 0 refills | Status: DC

## 2017-07-29 NOTE — ED Notes (Signed)
Patient taken to XRAY
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
## 2017-08-04 ENCOUNTER — Other Ambulatory Visit: Payer: Self-pay

## 2017-08-04 ENCOUNTER — Encounter (HOSPITAL_COMMUNITY): Payer: Self-pay | Admitting: Emergency Medicine

## 2017-08-04 ENCOUNTER — Emergency Department (HOSPITAL_COMMUNITY)
Admission: EM | Admit: 2017-08-04 | Discharge: 2017-08-04 | Disposition: A | Discharge status: Home / Self Care | Payer: Medicare Other | Attending: Emergency Medicine | Admitting: Emergency Medicine

## 2017-08-04 ENCOUNTER — Emergency Department (HOSPITAL_COMMUNITY): Payer: Medicare Other

## 2017-08-04 DIAGNOSIS — R509 Fever, unspecified: Secondary | ICD-10-CM | POA: Diagnosis not present

## 2017-08-04 DIAGNOSIS — R05 Cough: Secondary | ICD-10-CM | POA: Insufficient documentation

## 2017-08-04 DIAGNOSIS — M7918 Myalgia, other site: Secondary | ICD-10-CM | POA: Insufficient documentation

## 2017-08-04 DIAGNOSIS — R112 Nausea with vomiting, unspecified: Secondary | ICD-10-CM | POA: Insufficient documentation

## 2017-08-04 DIAGNOSIS — F17210 Nicotine dependence, cigarettes, uncomplicated: Secondary | ICD-10-CM | POA: Insufficient documentation

## 2017-08-04 DIAGNOSIS — R6889 Other general symptoms and signs: Secondary | ICD-10-CM

## 2017-08-04 DIAGNOSIS — R062 Wheezing: Secondary | ICD-10-CM | POA: Diagnosis not present

## 2017-08-04 LAB — BASIC METABOLIC PANEL
ANION GAP: 11 (ref 5–15)
BUN: 10 mg/dL (ref 6–20)
CALCIUM: 8.9 mg/dL (ref 8.9–10.3)
CO2: 23 mmol/L (ref 22–32)
Chloride: 102 mmol/L (ref 101–111)
Creatinine, Ser: 0.97 mg/dL (ref 0.61–1.24)
GFR calc Af Amer: >60 mL/min (ref >60)
GLUCOSE: 108 mg/dL — AB (ref 65–99)
Potassium: 3.8 mmol/L (ref 3.5–5.1)
SODIUM: 136 mmol/L (ref 135–145)

## 2017-08-04 LAB — CBC WITH DIFFERENTIAL/PLATELET
BASOS ABS: 0 10*3/uL (ref 0.0–0.1)
BASOS PCT: 0 %
EOS ABS: 0 10*3/uL (ref 0.0–0.7)
EOS PCT: 0 %
HCT: 42.5 % (ref 39.0–52.0)
Hemoglobin: 14.3 g/dL (ref 13.0–17.0)
LYMPHS PCT: 14 %
Lymphs Abs: 2.7 10*3/uL (ref 0.7–4.0)
MCH: 29.3 pg (ref 26.0–34.0)
MCHC: 33.6 g/dL (ref 30.0–36.0)
MCV: 87.1 fL (ref 78.0–100.0)
MONO ABS: 1.5 10*3/uL — AB (ref 0.1–1.0)
Monocytes Relative: 8 %
Neutro Abs: 15.1 10*3/uL — ABNORMAL HIGH (ref 1.7–7.7)
Neutrophils Relative %: 78 %
PLATELETS: 215 10*3/uL (ref 150–400)
RBC: 4.88 MIL/uL (ref 4.22–5.81)
RDW: 13 % (ref 11.5–15.5)
WBC: 19.2 10*3/uL — AB (ref 4.0–10.5)

## 2017-08-04 MED ORDER — AZITHROMYCIN 250 MG PO TABS: 250.0000 mg | ORAL_TABLET | Freq: Every day | ORAL | 0 refills | Status: DC

## 2017-08-04 MED ORDER — ALBUTEROL SULFATE HFA 108 (90 BASE) MCG/ACT IN AERS: 2.0000 | INHALATION_SPRAY | RESPIRATORY_TRACT | 0 refills | Status: DC | PRN

## 2017-08-04 MED ORDER — IBUPROFEN 600 MG PO TABS: 600.0000 mg | ORAL_TABLET | Freq: Four times a day (QID) | ORAL | 0 refills | Status: DC | PRN

## 2017-08-04 MED ORDER — OSELTAMIVIR PHOSPHATE 75 MG PO CAPS: 75.0000 mg | ORAL_CAPSULE | Freq: Two times a day (BID) | ORAL | 0 refills | Status: DC
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
## 2017-08-04 NOTE — Discharge Instructions (Signed)
You were seen today for flulike illness.  Your chest x-ray does not show any pneumonia; however, given your symptoms, you will be treated with azithromycin.  Take ibuprofen as needed for fever and chills.  Make sure that you are staying hydrated.

## 2017-08-04 NOTE — ED Notes (Signed)
Pt ambulated well in the hall, no s/s increased respiratory efforts noted while ambulating and pt's O2 saturation remained 96% on room air.
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
## 2017-08-31 ENCOUNTER — Encounter (HOSPITAL_COMMUNITY): Payer: Self-pay | Admitting: Emergency Medicine

## 2017-08-31 ENCOUNTER — Emergency Department (HOSPITAL_COMMUNITY)
Admission: EM | Admit: 2017-08-31 | Discharge: 2017-08-31 | Disposition: A | Discharge status: Home / Self Care | Payer: Medicare Other | Attending: Emergency Medicine | Admitting: Emergency Medicine

## 2017-08-31 ENCOUNTER — Other Ambulatory Visit: Payer: Self-pay

## 2017-08-31 DIAGNOSIS — J029 Acute pharyngitis, unspecified: Secondary | ICD-10-CM | POA: Insufficient documentation

## 2017-08-31 DIAGNOSIS — H9202 Otalgia, left ear: Secondary | ICD-10-CM | POA: Diagnosis not present

## 2017-08-31 DIAGNOSIS — R0981 Nasal congestion: Secondary | ICD-10-CM | POA: Diagnosis not present

## 2017-08-31 DIAGNOSIS — J111 Influenza due to unidentified influenza virus with other respiratory manifestations: Secondary | ICD-10-CM

## 2017-08-31 DIAGNOSIS — F17210 Nicotine dependence, cigarettes, uncomplicated: Secondary | ICD-10-CM | POA: Diagnosis not present

## 2017-08-31 DIAGNOSIS — R509 Fever, unspecified: Secondary | ICD-10-CM | POA: Insufficient documentation

## 2017-08-31 DIAGNOSIS — R05 Cough: Secondary | ICD-10-CM | POA: Insufficient documentation

## 2017-08-31 DIAGNOSIS — Z885 Allergy status to narcotic agent status: Secondary | ICD-10-CM | POA: Diagnosis not present

## 2017-08-31 DIAGNOSIS — M791 Myalgia, unspecified site: Secondary | ICD-10-CM | POA: Diagnosis not present

## 2017-08-31 DIAGNOSIS — R69 Illness, unspecified: Secondary | ICD-10-CM

## 2017-08-31 MED ORDER — KETOROLAC TROMETHAMINE 60 MG/2ML IM SOLN
15.0000 mg | Freq: Once | INTRAMUSCULAR | Status: AC
  Administered 2017-08-31: 15 mg via INTRAMUSCULAR
  Filled 2017-08-31: qty 2

## 2017-08-31 MED ORDER — DEXAMETHASONE 4 MG PO TABS
10.0000 mg | ORAL_TABLET | Freq: Once | ORAL | Status: AC
  Administered 2017-08-31: 06:00:00 10 mg via ORAL
  Filled 2017-08-31: qty 2

## 2017-08-31 MED ORDER — ACETAMINOPHEN 500 MG PO TABS
1000.0000 mg | ORAL_TABLET | Freq: Once | ORAL | Status: AC
  Administered 2017-08-31: 1000 mg via ORAL
  Filled 2017-08-31: qty 2
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
## 2017-08-31 NOTE — Discharge Instructions (Signed)
Take tylenol 2 pills 4 times a day and motrin 4 pills 3 times a day.  Drink plenty of fluids.  Return for worsening shortness of breath, headache, confusion. Follow up with your family doctor.   

## 2017-08-31 NOTE — ED Triage Notes (Signed)
Pt arriving with sore throat and generalized pain for a few days. Pt states he is also having pain in left ear that just started today.

## 2017-10-15 ENCOUNTER — Emergency Department (HOSPITAL_COMMUNITY)
Admission: EM | Admit: 2017-10-15 | Discharge: 2017-10-15 | Disposition: A | Discharge status: Home / Self Care | Payer: Medicare Other | Attending: Emergency Medicine | Admitting: Emergency Medicine

## 2017-10-15 ENCOUNTER — Other Ambulatory Visit: Payer: Self-pay

## 2017-10-15 ENCOUNTER — Encounter (HOSPITAL_COMMUNITY): Payer: Self-pay | Admitting: *Deleted

## 2017-10-15 DIAGNOSIS — J029 Acute pharyngitis, unspecified: Secondary | ICD-10-CM

## 2017-10-15 DIAGNOSIS — F17210 Nicotine dependence, cigarettes, uncomplicated: Secondary | ICD-10-CM | POA: Insufficient documentation

## 2017-10-15 DIAGNOSIS — Z79899 Other long term (current) drug therapy: Secondary | ICD-10-CM | POA: Diagnosis not present

## 2017-10-15 LAB — RAPID STREP SCREEN (MED CTR MEBANE ONLY): Streptococcus, Group A Screen (Direct): NEGATIVE

## 2017-10-15 MED ORDER — LIDOCAINE VISCOUS 2 % MT SOLN: 15.0000 mL | OROMUCOSAL | 0 refills | Status: DC | PRN

## 2017-10-15 MED ORDER — PENICILLIN G BENZATHINE 1200000 UNIT/2ML IM SUSP
1.2000 10*6.[IU] | Freq: Once | INTRAMUSCULAR | Status: AC
  Administered 2017-10-15: 1.2 10*6.[IU] via INTRAMUSCULAR
  Filled 2017-10-15: qty 2
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
## 2017-10-15 NOTE — Discharge Instructions (Signed)
Take the prescribed medication as directed. Follow-up with the wellness clinic-- can call for appt. Return to the ED for new or worsening symptoms.

## 2017-10-15 NOTE — ED Notes (Signed)
Bed: WTR8 Expected date:  Expected time:  Means of arrival:  Comments: 

## 2017-10-18 LAB — CULTURE, GROUP A STREP (THRC)

## 2017-10-28 ENCOUNTER — Emergency Department (HOSPITAL_COMMUNITY)
Admission: EM | Admit: 2017-10-28 | Discharge: 2017-10-28 | Disposition: A | Discharge status: Home / Self Care | Payer: Medicare Other

## 2017-10-28 ENCOUNTER — Other Ambulatory Visit: Payer: Self-pay

## 2017-11-17 ENCOUNTER — Emergency Department (HOSPITAL_COMMUNITY)
Admission: EM | Admit: 2017-11-17 | Discharge: 2017-11-17 | Disposition: A | Discharge status: Home / Self Care | Payer: Medicare Other | Attending: Emergency Medicine | Admitting: Emergency Medicine

## 2017-11-17 ENCOUNTER — Other Ambulatory Visit: Payer: Self-pay

## 2017-11-17 ENCOUNTER — Encounter (HOSPITAL_COMMUNITY): Payer: Self-pay | Admitting: *Deleted

## 2017-11-17 ENCOUNTER — Emergency Department (HOSPITAL_COMMUNITY): Payer: Medicare Other

## 2017-11-17 DIAGNOSIS — S83412A Sprain of medial collateral ligament of left knee, initial encounter: Secondary | ICD-10-CM | POA: Insufficient documentation

## 2017-11-17 DIAGNOSIS — Y929 Unspecified place or not applicable: Secondary | ICD-10-CM | POA: Diagnosis not present

## 2017-11-17 DIAGNOSIS — X509XXA Other and unspecified overexertion or strenuous movements or postures, initial encounter: Secondary | ICD-10-CM | POA: Diagnosis not present

## 2017-11-17 DIAGNOSIS — Y9301 Activity, walking, marching and hiking: Secondary | ICD-10-CM | POA: Diagnosis not present

## 2017-11-17 DIAGNOSIS — Y999 Unspecified external cause status: Secondary | ICD-10-CM | POA: Insufficient documentation

## 2017-11-17 DIAGNOSIS — F17210 Nicotine dependence, cigarettes, uncomplicated: Secondary | ICD-10-CM | POA: Diagnosis not present

## 2017-11-17 DIAGNOSIS — M25462 Effusion, left knee: Secondary | ICD-10-CM | POA: Diagnosis not present

## 2017-11-17 DIAGNOSIS — Z79899 Other long term (current) drug therapy: Secondary | ICD-10-CM | POA: Insufficient documentation

## 2017-11-17 DIAGNOSIS — S8992XA Unspecified injury of left lower leg, initial encounter: Secondary | ICD-10-CM | POA: Diagnosis present
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
## 2017-11-17 NOTE — Discharge Instructions (Signed)
Wear Ace bandage for compression and comfort.  Ice for 20 minutes every 2 hours while awake for the next 2 days.  Weightbearing as tolerated.  Ibuprofen 600 mg every 6 hours as needed for pain.  Follow-up with your primary doctor if not improving in the next 2 weeks.

## 2017-11-17 NOTE — ED Triage Notes (Signed)
Pt was walking on an incline, slipped and fell onto L knee. Reports knee "has been popping in and out" since falling earlier tonight. Mild swelling noted

## 2017-11-17 NOTE — ED Notes (Signed)
PT states understanding of care given, follow up care. PT ambulated from ED to car with a steady gait.  

## 2017-11-17 NOTE — ED Notes (Signed)
Patient is A&Ox4.  No signs of distress noted.  Please see providers complete history and physical exam.  

## 2018-03-20 ENCOUNTER — Encounter (HOSPITAL_COMMUNITY): Payer: Self-pay | Admitting: Emergency Medicine

## 2018-03-20 ENCOUNTER — Ambulatory Visit (HOSPITAL_COMMUNITY)
Admission: EM | Admit: 2018-03-20 | Discharge: 2018-03-20 | Disposition: A | Discharge status: Home / Self Care | Payer: Medicare Other | Attending: Family Medicine | Admitting: Family Medicine

## 2018-03-20 DIAGNOSIS — K529 Noninfective gastroenteritis and colitis, unspecified: Secondary | ICD-10-CM

## 2018-03-20 MED ORDER — ONDANSETRON 4 MG PO TBDP: 4.0000 mg | ORAL_TABLET | Freq: Three times a day (TID) | ORAL | 0 refills | Status: DC | PRN

## 2018-03-20 MED ORDER — ONDANSETRON 4 MG PO TBDP
ORAL_TABLET | ORAL | Status: AC
  Filled 2018-03-20: qty 1

## 2018-03-20 MED ORDER — ONDANSETRON 4 MG PO TBDP
4.0000 mg | ORAL_TABLET | Freq: Once | ORAL | Status: AC
  Administered 2018-03-20: 4 mg via ORAL

## 2018-03-20 NOTE — Discharge Instructions (Signed)
Small frequent sips of fluids- Pedialyte, Gatorade, water, broth- to maintain hydration.   Zofran as needed for nausea or vomiting. May advance to bland diet tomorrow as tolerated.  Advance to regular diet as tolerated.  If develop increased pain, fevers, dehydration please return or go to Er.
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
## 2018-03-20 NOTE — ED Triage Notes (Signed)
Pt c/o vomiting and diarrhea all morning. Able to sip on some ginger ale now

## 2018-06-19 ENCOUNTER — Emergency Department (HOSPITAL_COMMUNITY)
Admission: EM | Admit: 2018-06-19 | Discharge: 2018-06-19 | Disposition: A | Discharge status: Home / Self Care | Payer: Medicare Other | Attending: Emergency Medicine | Admitting: Emergency Medicine

## 2018-06-19 ENCOUNTER — Encounter (HOSPITAL_COMMUNITY): Payer: Self-pay | Admitting: *Deleted

## 2018-06-19 ENCOUNTER — Other Ambulatory Visit: Payer: Self-pay

## 2018-06-19 DIAGNOSIS — F17210 Nicotine dependence, cigarettes, uncomplicated: Secondary | ICD-10-CM | POA: Insufficient documentation

## 2018-06-19 DIAGNOSIS — J029 Acute pharyngitis, unspecified: Secondary | ICD-10-CM | POA: Diagnosis not present

## 2018-06-19 DIAGNOSIS — Z79899 Other long term (current) drug therapy: Secondary | ICD-10-CM | POA: Insufficient documentation

## 2018-06-19 DIAGNOSIS — J028 Acute pharyngitis due to other specified organisms: Secondary | ICD-10-CM | POA: Diagnosis not present

## 2018-06-19 DIAGNOSIS — R070 Pain in throat: Secondary | ICD-10-CM | POA: Diagnosis present

## 2018-06-19 DIAGNOSIS — B9789 Other viral agents as the cause of diseases classified elsewhere: Secondary | ICD-10-CM | POA: Diagnosis not present

## 2018-06-19 LAB — GROUP A STREP BY PCR: GROUP A STREP BY PCR: NOT DETECTED

## 2018-06-19 MED ORDER — ALBUTEROL SULFATE HFA 108 (90 BASE) MCG/ACT IN AERS: 2.0000 | INHALATION_SPRAY | Freq: Four times a day (QID) | RESPIRATORY_TRACT | 2 refills | Status: DC | PRN

## 2018-06-19 NOTE — Discharge Instructions (Signed)
You can use cepachol drops to try and help resolve the symptoms.  If you develop any inability to swallow food or feel you have trouble breathing, call 911 immediately.

## 2018-06-19 NOTE — ED Notes (Signed)
Gave pt hot tea
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
## 2018-06-19 NOTE — ED Triage Notes (Signed)
Pt in c/o sore throat onset x 2 days with body aches, pt afebrile, A&O x4

## 2018-06-21 ENCOUNTER — Encounter (HOSPITAL_COMMUNITY): Payer: Self-pay | Admitting: Emergency Medicine

## 2018-06-21 ENCOUNTER — Emergency Department (HOSPITAL_COMMUNITY)
Admission: EM | Admit: 2018-06-21 | Discharge: 2018-06-21 | Disposition: A | Discharge status: Home / Self Care | Payer: Medicare Other | Attending: Emergency Medicine | Admitting: Emergency Medicine

## 2018-06-21 ENCOUNTER — Other Ambulatory Visit: Payer: Self-pay

## 2018-06-21 DIAGNOSIS — J029 Acute pharyngitis, unspecified: Secondary | ICD-10-CM | POA: Diagnosis not present

## 2018-06-21 DIAGNOSIS — B349 Viral infection, unspecified: Secondary | ICD-10-CM | POA: Insufficient documentation

## 2018-06-21 DIAGNOSIS — R070 Pain in throat: Secondary | ICD-10-CM | POA: Diagnosis not present

## 2018-06-21 DIAGNOSIS — F17210 Nicotine dependence, cigarettes, uncomplicated: Secondary | ICD-10-CM | POA: Insufficient documentation

## 2018-06-21 DIAGNOSIS — R52 Pain, unspecified: Secondary | ICD-10-CM | POA: Diagnosis not present

## 2018-06-21 LAB — COMPREHENSIVE METABOLIC PANEL
ALT: 20 U/L (ref 0–44)
AST: 20 U/L (ref 15–41)
Albumin: 4.1 g/dL (ref 3.5–5.0)
Alkaline Phosphatase: 56 U/L (ref 38–126)
Anion gap: 11 (ref 5–15)
BUN: 11 mg/dL (ref 6–20)
CO2: 23 mmol/L (ref 22–32)
Calcium: 8.9 mg/dL (ref 8.9–10.3)
Chloride: 100 mmol/L (ref 98–111)
Creatinine, Ser: 1.05 mg/dL (ref 0.61–1.24)
GFR calc Af Amer: >60 mL/min (ref >60)
GFR calc non Af Amer: >60 mL/min (ref >60)
Glucose, Bld: 92 mg/dL (ref 70–99)
Potassium: 3.8 mmol/L (ref 3.5–5.1)
Sodium: 134 mmol/L — ABNORMAL LOW (ref 135–145)
Total Bilirubin: 0.8 mg/dL (ref 0.3–1.2)
Total Protein: 7.3 g/dL (ref 6.5–8.1)

## 2018-06-21 LAB — CBC WITH DIFFERENTIAL/PLATELET
Abs Immature Granulocytes: 0.1 10*3/uL (ref 0.0–0.1)
Basophils Absolute: 0 10*3/uL (ref 0.0–0.1)
Basophils Relative: 0 %
Eosinophils Absolute: 0 10*3/uL (ref 0.0–0.7)
Eosinophils Relative: 0 %
HCT: 47.9 % (ref 39.0–52.0)
Hemoglobin: 15.6 g/dL (ref 13.0–17.0)
Immature Granulocytes: 0 %
Lymphocytes Relative: 21 %
Lymphs Abs: 3.2 10*3/uL (ref 0.7–4.0)
MCH: 29.1 pg (ref 26.0–34.0)
MCHC: 32.6 g/dL (ref 30.0–36.0)
MCV: 89.4 fL (ref 78.0–100.0)
Monocytes Absolute: 1.9 10*3/uL — ABNORMAL HIGH (ref 0.1–1.0)
Monocytes Relative: 12 %
Neutro Abs: 10.5 10*3/uL — ABNORMAL HIGH (ref 1.7–7.7)
Neutrophils Relative %: 67 %
Platelets: 197 10*3/uL (ref 150–400)
RBC: 5.36 MIL/uL (ref 4.22–5.81)
RDW: 12.9 % (ref 11.5–15.5)
WBC: 15.7 10*3/uL — ABNORMAL HIGH (ref 4.0–10.5)

## 2018-06-21 LAB — LIPASE, BLOOD: Lipase: 28 U/L (ref 11–51)

## 2018-06-21 LAB — GROUP A STREP BY PCR: GROUP A STREP BY PCR: NOT DETECTED

## 2018-06-21 MED ORDER — KETOROLAC TROMETHAMINE 30 MG/ML IJ SOLN
30.0000 mg | Freq: Once | INTRAMUSCULAR | Status: DC
  Filled 2018-06-21: qty 1

## 2018-06-21 MED ORDER — DEXAMETHASONE 4 MG PO TABS
10.0000 mg | ORAL_TABLET | Freq: Once | ORAL | Status: AC
  Administered 2018-06-21: 10 mg via ORAL
  Filled 2018-06-21: qty 3

## 2018-06-21 MED ORDER — KETOROLAC TROMETHAMINE 30 MG/ML IJ SOLN
30.0000 mg | Freq: Once | INTRAMUSCULAR | Status: AC
  Administered 2018-06-21: 30 mg via INTRAVENOUS

## 2018-06-21 MED ORDER — SODIUM CHLORIDE 0.9 % IV BOLUS
500.0000 mL | Freq: Once | INTRAVENOUS | Status: AC
  Administered 2018-06-21: 500 mL via INTRAVENOUS

## 2018-06-21 MED ORDER — IBUPROFEN 600 MG PO TABS: 600.0000 mg | ORAL_TABLET | Freq: Four times a day (QID) | ORAL | 0 refills | Status: DC | PRN

## 2018-06-21 MED ORDER — ACETAMINOPHEN 500 MG PO TABS: 500.0000 mg | ORAL_TABLET | Freq: Four times a day (QID) | ORAL | 0 refills | Status: DC | PRN
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
## 2018-06-21 NOTE — ED Notes (Signed)
Pt. Stated that he feels so weak.

## 2018-06-21 NOTE — Discharge Instructions (Addendum)
You can alternate ibuprofen and Tylenol as prescribed, as needed for pain.  Make sure to drink plenty of fluids and get plenty of rest.  Gargle with warm salt water few times daily.  Drink tea with honey and lemon to help soothe your throat.  Please return the emergency department if you develop any new or worsening symptoms.

## 2018-06-21 NOTE — ED Notes (Signed)
Patient verbalizes understanding of discharge instructions. Opportunity for questioning and answers were provided. Armband removed by staff, pt discharged from ED.  

## 2018-07-21 ENCOUNTER — Encounter (HOSPITAL_COMMUNITY): Payer: Self-pay | Admitting: *Deleted

## 2018-07-21 ENCOUNTER — Other Ambulatory Visit: Payer: Self-pay

## 2018-07-21 ENCOUNTER — Emergency Department (HOSPITAL_COMMUNITY)
Admission: EM | Admit: 2018-07-21 | Discharge: 2018-07-21 | Disposition: A | Discharge status: Home / Self Care | Payer: Medicare Other | Attending: Emergency Medicine | Admitting: Emergency Medicine

## 2018-07-21 ENCOUNTER — Emergency Department (HOSPITAL_COMMUNITY): Payer: Medicare Other

## 2018-07-21 DIAGNOSIS — F17210 Nicotine dependence, cigarettes, uncomplicated: Secondary | ICD-10-CM | POA: Insufficient documentation

## 2018-07-21 DIAGNOSIS — Z79899 Other long term (current) drug therapy: Secondary | ICD-10-CM | POA: Diagnosis not present

## 2018-07-21 DIAGNOSIS — Y999 Unspecified external cause status: Secondary | ICD-10-CM | POA: Diagnosis not present

## 2018-07-21 DIAGNOSIS — Y929 Unspecified place or not applicable: Secondary | ICD-10-CM | POA: Diagnosis not present

## 2018-07-21 DIAGNOSIS — Y939 Activity, unspecified: Secondary | ICD-10-CM | POA: Insufficient documentation

## 2018-07-21 DIAGNOSIS — R071 Chest pain on breathing: Secondary | ICD-10-CM | POA: Diagnosis not present

## 2018-07-21 DIAGNOSIS — M67471 Ganglion, right ankle and foot: Secondary | ICD-10-CM | POA: Diagnosis not present

## 2018-07-21 DIAGNOSIS — R079 Chest pain, unspecified: Secondary | ICD-10-CM | POA: Diagnosis not present

## 2018-07-21 DIAGNOSIS — S29019A Strain of muscle and tendon of unspecified wall of thorax, initial encounter: Secondary | ICD-10-CM | POA: Diagnosis not present

## 2018-07-21 DIAGNOSIS — X58XXXA Exposure to other specified factors, initial encounter: Secondary | ICD-10-CM | POA: Diagnosis not present

## 2018-07-21 DIAGNOSIS — R0789 Other chest pain: Secondary | ICD-10-CM

## 2018-07-21 DIAGNOSIS — S29012A Strain of muscle and tendon of back wall of thorax, initial encounter: Secondary | ICD-10-CM | POA: Diagnosis not present

## 2018-07-21 DIAGNOSIS — M6740 Ganglion, unspecified site: Secondary | ICD-10-CM

## 2018-07-21 LAB — BASIC METABOLIC PANEL
Anion gap: 7 (ref 5–15)
BUN: 9 mg/dL (ref 6–20)
CHLORIDE: 105 mmol/L (ref 98–111)
CO2: 25 mmol/L (ref 22–32)
CREATININE: 0.86 mg/dL (ref 0.61–1.24)
Calcium: 9 mg/dL (ref 8.9–10.3)
GFR calc Af Amer: >60 mL/min (ref >60)
GFR calc non Af Amer: >60 mL/min (ref >60)
GLUCOSE: 106 mg/dL — AB (ref 70–99)
POTASSIUM: 3.8 mmol/L (ref 3.5–5.1)
SODIUM: 137 mmol/L (ref 135–145)

## 2018-07-21 LAB — I-STAT TROPONIN, ED: Troponin i, poc: 0 ng/mL (ref 0.00–0.08)

## 2018-07-21 LAB — CBC
HEMATOCRIT: 47 % (ref 39.0–52.0)
HEMOGLOBIN: 15.3 g/dL (ref 13.0–17.0)
MCH: 28.8 pg (ref 26.0–34.0)
MCHC: 32.6 g/dL (ref 30.0–36.0)
MCV: 88.5 fL (ref 80.0–100.0)
Platelets: 292 10*3/uL (ref 150–400)
RBC: 5.31 MIL/uL (ref 4.22–5.81)
RDW: 13.1 % (ref 11.5–15.5)
WBC: 11.5 10*3/uL — AB (ref 4.0–10.5)
nRBC: 0 % (ref 0.0–0.2)

## 2018-07-21 MED ORDER — CYCLOBENZAPRINE HCL 10 MG PO TABS
10.0000 mg | ORAL_TABLET | Freq: Once | ORAL | Status: AC
  Administered 2018-07-21: 10 mg via ORAL
  Filled 2018-07-21: qty 1

## 2018-07-21 MED ORDER — KETOROLAC TROMETHAMINE 60 MG/2ML IM SOLN
60.0000 mg | Freq: Once | INTRAMUSCULAR | Status: AC
  Administered 2018-07-21: 60 mg via INTRAMUSCULAR
  Filled 2018-07-21: qty 2

## 2018-07-21 MED ORDER — NAPROXEN 500 MG PO TABS: 500.0000 mg | ORAL_TABLET | Freq: Two times a day (BID) | ORAL | 0 refills | Status: DC

## 2018-07-21 MED ORDER — CYCLOBENZAPRINE HCL 10 MG PO TABS: 10.0000 mg | ORAL_TABLET | Freq: Two times a day (BID) | ORAL | 0 refills | Status: DC | PRN
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
## 2018-07-21 NOTE — ED Triage Notes (Signed)
C/o right anterior chest pain onset 1 week ago no change today just not going away. C/o left knee pain and ankle pain.

## 2018-07-21 NOTE — ED Notes (Signed)
Pt verbalized understanding of discharge paperwork, follow-up care and prescriptions. Ambulatory on discharge.

## 2018-07-21 NOTE — Discharge Instructions (Addendum)
Thank you for allowing me to care for you today in the Emergency Department.   Call the number on your discharge paperwork to get established with a primary care provider if your symptoms do not start to improve in the next 4 to 5 days with a treatment regimen below.  You can also follow-up with orthopedics regarding the cyst on your right ankle.  For pain at home, take naproxen 2 times daily for the next 4 to 5 days.  Make sure to take this with food so it does not upset your stomach.  You can also take Flexeril 2 times daily for muscle pain and spasms.  Use caution with this medication because it can make you drowsy if you have to work or drive.  Do not take ibuprofen, Advil, or other NSAIDs while taking this regimen.  You can also take 650 mg of Tylenol every 6 hours if your pain returns before your next dose of naproxen.  Return to the emergency department if you develop new or worsening symptoms including a high fever, because of shortness of breath, chest pain with sweating, numbness, weakness, or other new, concerning symptoms.

## 2018-07-30 ENCOUNTER — Emergency Department (HOSPITAL_COMMUNITY)
Admission: EM | Admit: 2018-07-30 | Discharge: 2018-07-30 | Disposition: A | Discharge status: Home / Self Care | Payer: Medicare Other | Attending: Emergency Medicine | Admitting: Emergency Medicine

## 2018-07-30 ENCOUNTER — Encounter (HOSPITAL_COMMUNITY): Payer: Self-pay | Admitting: Emergency Medicine

## 2018-07-30 ENCOUNTER — Emergency Department (HOSPITAL_COMMUNITY): Payer: Medicare Other

## 2018-07-30 DIAGNOSIS — F17210 Nicotine dependence, cigarettes, uncomplicated: Secondary | ICD-10-CM | POA: Diagnosis not present

## 2018-07-30 DIAGNOSIS — M62830 Muscle spasm of back: Secondary | ICD-10-CM | POA: Diagnosis not present

## 2018-07-30 DIAGNOSIS — M545 Low back pain: Secondary | ICD-10-CM | POA: Diagnosis not present

## 2018-07-30 DIAGNOSIS — M62838 Other muscle spasm: Secondary | ICD-10-CM

## 2018-07-30 DIAGNOSIS — Z79899 Other long term (current) drug therapy: Secondary | ICD-10-CM | POA: Diagnosis not present

## 2018-07-30 LAB — BASIC METABOLIC PANEL
Anion gap: 3 — ABNORMAL LOW (ref 5–15)
BUN: 9 mg/dL (ref 6–20)
CO2: 30 mmol/L (ref 22–32)
Calcium: 9.4 mg/dL (ref 8.9–10.3)
Chloride: 107 mmol/L (ref 98–111)
Creatinine, Ser: 0.89 mg/dL (ref 0.61–1.24)
GFR calc Af Amer: >60 mL/min (ref >60)
GFR calc non Af Amer: >60 mL/min (ref >60)
Glucose, Bld: 90 mg/dL (ref 70–99)
Potassium: 4.3 mmol/L (ref 3.5–5.1)
Sodium: 140 mmol/L (ref 135–145)

## 2018-07-30 LAB — URINALYSIS, ROUTINE W REFLEX MICROSCOPIC
Bilirubin Urine: NEGATIVE
Glucose, UA: NEGATIVE mg/dL
Hgb urine dipstick: NEGATIVE
Ketones, ur: NEGATIVE mg/dL
Leukocytes, UA: NEGATIVE
Nitrite: NEGATIVE
Protein, ur: NEGATIVE mg/dL
Specific Gravity, Urine: 1.025 (ref 1.005–1.030)
pH: 5 (ref 5.0–8.0)

## 2018-07-30 LAB — CK: Total CK: 295 U/L (ref 49–397)

## 2018-07-30 MED ORDER — CYCLOBENZAPRINE HCL 10 MG PO TABS: 10.0000 mg | ORAL_TABLET | Freq: Two times a day (BID) | ORAL | 0 refills | Status: DC | PRN

## 2018-07-30 MED ORDER — KETOROLAC TROMETHAMINE 30 MG/ML IJ SOLN
60.0000 mg | Freq: Once | INTRAMUSCULAR | Status: AC
  Administered 2018-07-30: 60 mg via INTRAMUSCULAR
  Filled 2018-07-30: qty 2

## 2018-07-30 NOTE — ED Triage Notes (Signed)
Patient reports persistent right lower back pain and generalized body aches for several months , denies injury , respirations unlabored , no fever or chills .
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
## 2018-09-05 ENCOUNTER — Ambulatory Visit (HOSPITAL_COMMUNITY)
Admission: EM | Admit: 2018-09-05 | Discharge: 2018-09-05 | Disposition: A | Discharge status: Home / Self Care | Payer: Medicare Other | Attending: Emergency Medicine | Admitting: Emergency Medicine

## 2018-09-05 ENCOUNTER — Other Ambulatory Visit: Payer: Self-pay

## 2018-09-05 ENCOUNTER — Encounter (HOSPITAL_COMMUNITY): Payer: Self-pay

## 2018-09-05 DIAGNOSIS — J4540 Moderate persistent asthma, uncomplicated: Secondary | ICD-10-CM | POA: Insufficient documentation

## 2018-09-05 MED ORDER — ALBUTEROL SULFATE HFA 108 (90 BASE) MCG/ACT IN AERS: 2.0000 | INHALATION_SPRAY | Freq: Four times a day (QID) | RESPIRATORY_TRACT | 2 refills | Status: DC | PRN
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
## 2018-09-05 NOTE — ED Triage Notes (Signed)
Pt cc needs a med refill on his asthma med.
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
## 2018-12-10 ENCOUNTER — Other Ambulatory Visit: Payer: Self-pay

## 2018-12-10 ENCOUNTER — Emergency Department (HOSPITAL_COMMUNITY): Payer: Medicare Other

## 2018-12-10 ENCOUNTER — Encounter (HOSPITAL_COMMUNITY): Payer: Self-pay

## 2018-12-10 ENCOUNTER — Emergency Department (HOSPITAL_COMMUNITY)
Admission: EM | Admit: 2018-12-10 | Discharge: 2018-12-10 | Disposition: A | Discharge status: Home / Self Care | Payer: Medicare Other | Attending: Emergency Medicine | Admitting: Emergency Medicine

## 2018-12-10 DIAGNOSIS — F17210 Nicotine dependence, cigarettes, uncomplicated: Secondary | ICD-10-CM | POA: Diagnosis not present

## 2018-12-10 DIAGNOSIS — R509 Fever, unspecified: Secondary | ICD-10-CM | POA: Insufficient documentation

## 2018-12-10 DIAGNOSIS — J029 Acute pharyngitis, unspecified: Secondary | ICD-10-CM

## 2018-12-10 DIAGNOSIS — R070 Pain in throat: Secondary | ICD-10-CM | POA: Insufficient documentation

## 2018-12-10 DIAGNOSIS — R05 Cough: Secondary | ICD-10-CM | POA: Diagnosis not present

## 2018-12-10 LAB — INFLUENZA PANEL BY PCR (TYPE A & B)
Influenza A By PCR: NEGATIVE
Influenza B By PCR: NEGATIVE

## 2018-12-10 LAB — GROUP A STREP BY PCR: Group A Strep by PCR: NOT DETECTED

## 2018-12-10 MED ORDER — DEXAMETHASONE SODIUM PHOSPHATE 10 MG/ML IJ SOLN
10.0000 mg | Freq: Once | INTRAMUSCULAR | Status: AC
  Administered 2018-12-10: 10 mg via INTRAMUSCULAR
  Filled 2018-12-10: qty 1

## 2018-12-10 MED ORDER — PHENOL 1.4 % MT LIQD: 1.0000 | OROMUCOSAL | 0 refills | Status: DC | PRN

## 2018-12-10 MED ORDER — ALBUTEROL SULFATE HFA 108 (90 BASE) MCG/ACT IN AERS
1.0000 | INHALATION_SPRAY | Freq: Once | RESPIRATORY_TRACT | Status: AC
  Administered 2018-12-10: 2 via RESPIRATORY_TRACT
  Filled 2018-12-10: qty 6.7

## 2018-12-10 MED ORDER — IBUPROFEN 600 MG PO TABS: 600.0000 mg | ORAL_TABLET | Freq: Four times a day (QID) | ORAL | 0 refills | Status: DC | PRN

## 2018-12-10 MED ORDER — ACETAMINOPHEN 500 MG PO TABS: 500.0000 mg | ORAL_TABLET | Freq: Four times a day (QID) | ORAL | 0 refills | Status: AC | PRN

## 2018-12-10 MED ORDER — ACETAMINOPHEN 500 MG PO TABS
1000.0000 mg | ORAL_TABLET | Freq: Once | ORAL | Status: AC
  Administered 2018-12-10: 1000 mg via ORAL
  Filled 2018-12-10: qty 2

## 2018-12-10 NOTE — ED Notes (Signed)
Patient walking around nurses station, stating "I'm ready to go, I need my discharge papers". This RN informed patient the doctor is working on them right now please go back in your room and I will go get them as soon as they are ready.

## 2018-12-10 NOTE — ED Notes (Signed)
This RN attempted to review discharge instructions with the patient who stated "The doctor already went over everything. I can read it if I have questions". Given discharge papers. Patient A&O x 4. Ambulatory at discharge.
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
## 2018-12-10 NOTE — Discharge Instructions (Signed)
Alternate 600 mg of ibuprofen and [phone] mg of Tylenol every 3 hours as needed for pain and fever. Do not exceed 4000 mg of Tylenol daily.  Take ibuprofen with food to avoid upset stomach issues.  Drink plenty of water and get plenty of rest.  Use warm water salt gargles, warm teas, honey, Chloraseptic spray over-the-counter, and throat lozenges for management of your sore throat pain.  Can also use other over-the-counter cold and flu medicines for your symptoms. Please isolate yourself at home while you are having fever. Wash your hands frequently and thoroughly.  Avoid any contact with old people, young people, or people with medical problems.  Do not leave the house during this time.  Please return to the emergency department immediately if any concerning signs or symptoms develop such as worsening shortness of breath, throat tightness, drooling, severe chest pains, persistent cough.

## 2019-04-05 ENCOUNTER — Other Ambulatory Visit: Payer: Self-pay | Admitting: Family Medicine

## 2019-04-11 ENCOUNTER — Other Ambulatory Visit: Payer: Self-pay | Admitting: Family Medicine

## 2019-04-12 ENCOUNTER — Encounter (HOSPITAL_COMMUNITY): Payer: Self-pay | Admitting: Emergency Medicine

## 2019-04-12 ENCOUNTER — Other Ambulatory Visit: Payer: Self-pay

## 2019-04-12 ENCOUNTER — Ambulatory Visit (HOSPITAL_COMMUNITY)
Admission: EM | Admit: 2019-04-12 | Discharge: 2019-04-12 | Disposition: A | Discharge status: Home / Self Care | Payer: Medicare Other | Attending: Family Medicine | Admitting: Family Medicine

## 2019-04-12 DIAGNOSIS — Z760 Encounter for issue of repeat prescription: Secondary | ICD-10-CM | POA: Diagnosis not present

## 2019-04-12 DIAGNOSIS — J4520 Mild intermittent asthma, uncomplicated: Secondary | ICD-10-CM

## 2019-04-12 HISTORY — DX: Unspecified asthma, uncomplicated: J45.909

## 2019-04-12 MED ORDER — ALBUTEROL SULFATE HFA 108 (90 BASE) MCG/ACT IN AERS: 1.0000 | INHALATION_SPRAY | Freq: Four times a day (QID) | RESPIRATORY_TRACT | 1 refills | Status: DC | PRN
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
## 2019-04-12 NOTE — ED Triage Notes (Signed)
Pt here for asthma sx and needs refill on inhaler

## 2019-04-12 NOTE — Discharge Instructions (Addendum)
Albuterol inhaler refilled 

## 2019-05-29 ENCOUNTER — Other Ambulatory Visit: Payer: Self-pay

## 2019-05-29 ENCOUNTER — Telehealth (HOSPITAL_COMMUNITY): Payer: Self-pay | Admitting: Emergency Medicine

## 2019-05-29 ENCOUNTER — Encounter (HOSPITAL_COMMUNITY): Payer: Self-pay

## 2019-05-29 ENCOUNTER — Ambulatory Visit (INDEPENDENT_AMBULATORY_CARE_PROVIDER_SITE_OTHER): Payer: Medicare Other

## 2019-05-29 ENCOUNTER — Ambulatory Visit (HOSPITAL_COMMUNITY)
Admission: EM | Admit: 2019-05-29 | Discharge: 2019-05-29 | Disposition: A | Discharge status: Home / Self Care | Payer: Medicare Other | Attending: Family Medicine | Admitting: Family Medicine

## 2019-05-29 DIAGNOSIS — S60221A Contusion of right hand, initial encounter: Secondary | ICD-10-CM

## 2019-05-29 DIAGNOSIS — S46812A Strain of other muscles, fascia and tendons at shoulder and upper arm level, left arm, initial encounter: Secondary | ICD-10-CM

## 2019-05-29 DIAGNOSIS — R0789 Other chest pain: Secondary | ICD-10-CM

## 2019-05-29 DIAGNOSIS — S6991XA Unspecified injury of right wrist, hand and finger(s), initial encounter: Secondary | ICD-10-CM

## 2019-05-29 DIAGNOSIS — W228XXA Striking against or struck by other objects, initial encounter: Secondary | ICD-10-CM

## 2019-05-29 DIAGNOSIS — M79641 Pain in right hand: Secondary | ICD-10-CM | POA: Diagnosis not present

## 2019-05-29 MED ORDER — KETOROLAC TROMETHAMINE 30 MG/ML IJ SOLN
30.0000 mg | Freq: Once | INTRAMUSCULAR | Status: AC
  Administered 2019-05-29: 30 mg via INTRAMUSCULAR

## 2019-05-29 MED ORDER — ALBUTEROL SULFATE HFA 108 (90 BASE) MCG/ACT IN AERS
2.0000 | INHALATION_SPRAY | Freq: Four times a day (QID) | RESPIRATORY_TRACT | Status: DC | PRN
  Administered 2019-05-29: 18:00:00 2 via RESPIRATORY_TRACT

## 2019-05-29 MED ORDER — KETOROLAC TROMETHAMINE 30 MG/ML IJ SOLN
INTRAMUSCULAR | Status: AC
  Filled 2019-05-29: qty 1

## 2019-05-29 MED ORDER — ALBUTEROL SULFATE HFA 108 (90 BASE) MCG/ACT IN AERS: 1.0000 | INHALATION_SPRAY | Freq: Four times a day (QID) | RESPIRATORY_TRACT | 1 refills | Status: DC | PRN

## 2019-05-29 MED ORDER — IBUPROFEN 800 MG PO TABS: 800.0000 mg | ORAL_TABLET | Freq: Three times a day (TID) | ORAL | 0 refills | Status: DC

## 2019-05-29 MED ORDER — CYCLOBENZAPRINE HCL 5 MG PO TABS: 5.0000 mg | ORAL_TABLET | Freq: Two times a day (BID) | ORAL | 0 refills | Status: DC | PRN

## 2019-05-29 MED ORDER — ALBUTEROL SULFATE HFA 108 (90 BASE) MCG/ACT IN AERS
INHALATION_SPRAY | RESPIRATORY_TRACT | Status: AC
  Filled 2019-05-29: qty 6.7

## 2019-05-29 NOTE — Discharge Instructions (Signed)
Use anti-inflammatories for pain/swelling. You may take up to 800 mg Ibuprofen every 8 hours with food. You may supplement Ibuprofen with Tylenol [phone] mg every 8 hours.   You may use flexeril as needed to help with pain. This is a muscle relaxer and causes sedation- please use only at bedtime or when you will be home and not have to drive/work  Gentle stretching or shoulder to work out tension of chest/back  Ice hand Alternate ice and heat to back  Follow up if not resolving
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
## 2019-06-07 DIAGNOSIS — S63696A Other sprain of right little finger, initial encounter: Secondary | ICD-10-CM | POA: Diagnosis not present

## 2019-06-07 DIAGNOSIS — S63591A Other specified sprain of right wrist, initial encounter: Secondary | ICD-10-CM | POA: Diagnosis not present

## 2019-06-07 DIAGNOSIS — S638X1A Sprain of other part of right wrist and hand, initial encounter: Secondary | ICD-10-CM | POA: Diagnosis not present

## 2019-06-07 DIAGNOSIS — Z888 Allergy status to other drugs, medicaments and biological substances status: Secondary | ICD-10-CM | POA: Diagnosis not present

## 2019-08-17 ENCOUNTER — Ambulatory Visit (HOSPITAL_COMMUNITY)
Admission: EM | Admit: 2019-08-17 | Discharge: 2019-08-17 | Disposition: A | Discharge status: Home / Self Care | Payer: Medicare Other | Attending: Family Medicine | Admitting: Family Medicine

## 2019-08-17 ENCOUNTER — Other Ambulatory Visit: Payer: Self-pay

## 2019-08-17 ENCOUNTER — Encounter (HOSPITAL_COMMUNITY): Payer: Self-pay

## 2019-08-17 DIAGNOSIS — Z20828 Contact with and (suspected) exposure to other viral communicable diseases: Secondary | ICD-10-CM | POA: Insufficient documentation

## 2019-08-17 DIAGNOSIS — Z202 Contact with and (suspected) exposure to infections with a predominantly sexual mode of transmission: Secondary | ICD-10-CM | POA: Insufficient documentation

## 2019-08-17 DIAGNOSIS — Z0189 Encounter for other specified special examinations: Secondary | ICD-10-CM | POA: Insufficient documentation

## 2019-08-17 DIAGNOSIS — Z886 Allergy status to analgesic agent status: Secondary | ICD-10-CM | POA: Diagnosis not present

## 2019-08-17 DIAGNOSIS — J45909 Unspecified asthma, uncomplicated: Secondary | ICD-10-CM | POA: Insufficient documentation

## 2019-08-17 DIAGNOSIS — Z113 Encounter for screening for infections with a predominantly sexual mode of transmission: Secondary | ICD-10-CM

## 2019-08-17 DIAGNOSIS — F17210 Nicotine dependence, cigarettes, uncomplicated: Secondary | ICD-10-CM | POA: Insufficient documentation

## 2019-08-17 DIAGNOSIS — Z888 Allergy status to other drugs, medicaments and biological substances status: Secondary | ICD-10-CM | POA: Diagnosis not present

## 2019-08-17 MED ORDER — AZITHROMYCIN 250 MG PO TABS
ORAL_TABLET | ORAL | Status: AC
  Filled 2019-08-17: qty 4

## 2019-08-17 MED ORDER — CEFTRIAXONE SODIUM 250 MG IJ SOLR
250.0000 mg | Freq: Once | INTRAMUSCULAR | Status: AC
  Administered 2019-08-17: 14:00:00 250 mg via INTRAMUSCULAR

## 2019-08-17 MED ORDER — CEFTRIAXONE SODIUM 250 MG IJ SOLR
INTRAMUSCULAR | Status: AC
  Filled 2019-08-17: qty 250

## 2019-08-17 MED ORDER — AZITHROMYCIN 250 MG PO TABS
1000.0000 mg | ORAL_TABLET | Freq: Once | ORAL | Status: AC
  Administered 2019-08-17: 1000 mg via ORAL

## 2019-08-17 NOTE — ED Triage Notes (Signed)
Pt presents for covid testing; not displaying any symptoms.  Pt also presents for STD testing after partner tested positive for chylyamdia

## 2019-08-17 NOTE — Discharge Instructions (Addendum)
You have been given the following medications today for treatment of suspected gonorrhea and/or chlamydia:  cefTRIAXone (ROCEPHIN) injection 250 mg azithromycin (ZITHROMAX) tablet 1,000 mg  Please refrain from all sexual activity for at least the next seven days.  If your Covid-19 test is positive, you will get a phone call from [facility] regarding your results. If your Covid-19 test is negative, you will NOT get a phone call from [facility] with your results. You may view your results on MyChart. If you do not have a MyChart account, sign up instructions are in your discharge papers.

## 2019-08-19 LAB — NOVEL CORONAVIRUS, NAA (HOSP ORDER, SEND-OUT TO REF LAB; TAT 18-24 HRS): SARS-CoV-2, NAA: NOT DETECTED
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
## 2020-02-12 ENCOUNTER — Encounter (HOSPITAL_COMMUNITY): Payer: Self-pay

## 2020-02-12 ENCOUNTER — Other Ambulatory Visit: Payer: Self-pay

## 2020-02-12 ENCOUNTER — Ambulatory Visit (HOSPITAL_COMMUNITY)
Admission: EM | Admit: 2020-02-12 | Discharge: 2020-02-12 | Disposition: A | Discharge status: Home / Self Care | Payer: Medicare Other | Attending: Family Medicine | Admitting: Family Medicine

## 2020-02-12 DIAGNOSIS — J029 Acute pharyngitis, unspecified: Secondary | ICD-10-CM | POA: Diagnosis not present

## 2020-02-12 LAB — POCT RAPID STREP A: Streptococcus, Group A Screen (Direct): NEGATIVE

## 2020-02-12 MED ORDER — LIDOCAINE VISCOUS HCL 2 % MT SOLN
15.0000 mL | OROMUCOSAL | 0 refills | Status: DC | PRN
Start: 2020-02-12 — End: 2020-04-15

## 2020-02-12 MED ORDER — AMOXICILLIN-POT CLAVULANATE 875-125 MG PO TABS
1.0000 | ORAL_TABLET | Freq: Two times a day (BID) | ORAL | 0 refills | Status: DC
Start: 2020-02-12 — End: 2020-04-15

## 2020-02-12 MED ORDER — ALBUTEROL SULFATE HFA 108 (90 BASE) MCG/ACT IN AERS: 1.0000 | INHALATION_SPRAY | Freq: Four times a day (QID) | RESPIRATORY_TRACT | 1 refills | Status: DC | PRN

## 2020-02-12 NOTE — Discharge Instructions (Addendum)
Strep test negative, will send out for culture and we will call you with results Get plenty of rest and push fluids Augmentin was prescribed Viscous lidocaine prescribed.  This is an oral solution you can swish, and gargle as needed for symptomatic relief of sore throat.  Do not exceed 8 doses in a 24 hour period.  Do not use prior to eating, as this will numb your entire mouth.   Drink warm or cool liquids, use throat lozenges, or popsicles to help alleviate symptoms Take OTC ibuprofen or tylenol as needed for pain Follow up with PCP if symptoms persists Return or go to ER if patient has any new or worsening symptoms such as fever, chills, nausea, vomiting, worsening sore throat, cough, abdominal pain, chest pain, changes in bowel or bladder habits, etc..Marland Kitchen

## 2020-02-12 NOTE — ED Triage Notes (Signed)
Pt presents with sore throat past few days.
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
## 2020-02-14 LAB — CULTURE, GROUP A STREP (THRC)

## 2020-04-15 ENCOUNTER — Ambulatory Visit (HOSPITAL_COMMUNITY)
Admission: EM | Admit: 2020-04-15 | Discharge: 2020-04-15 | Disposition: A | Discharge status: Home / Self Care | Payer: Medicare Other | Attending: Family Medicine | Admitting: Family Medicine

## 2020-04-15 ENCOUNTER — Encounter (HOSPITAL_COMMUNITY): Payer: Self-pay | Admitting: Emergency Medicine

## 2020-04-15 ENCOUNTER — Other Ambulatory Visit: Payer: Self-pay

## 2020-04-15 DIAGNOSIS — R369 Urethral discharge, unspecified: Secondary | ICD-10-CM

## 2020-04-15 DIAGNOSIS — Z760 Encounter for issue of repeat prescription: Secondary | ICD-10-CM

## 2020-04-15 DIAGNOSIS — J4520 Mild intermittent asthma, uncomplicated: Secondary | ICD-10-CM | POA: Diagnosis not present

## 2020-04-15 MED ORDER — LIDOCAINE HCL (PF) 1 % IJ SOLN
INTRAMUSCULAR | Status: AC
  Filled 2020-04-15: qty 2

## 2020-04-15 MED ORDER — CEFTRIAXONE SODIUM 500 MG IJ SOLR
500.0000 mg | Freq: Once | INTRAMUSCULAR | Status: AC
  Administered 2020-04-15: 500 mg via INTRAMUSCULAR

## 2020-04-15 MED ORDER — DOXYCYCLINE HYCLATE 100 MG PO CAPS: 100.0000 mg | ORAL_CAPSULE | Freq: Two times a day (BID) | ORAL | 0 refills | Status: AC

## 2020-04-15 MED ORDER — PREDNISONE 50 MG PO TABS: 50.0000 mg | ORAL_TABLET | Freq: Every day | ORAL | 0 refills | Status: AC

## 2020-04-15 MED ORDER — ALBUTEROL SULFATE HFA 108 (90 BASE) MCG/ACT IN AERS: 1.0000 | INHALATION_SPRAY | Freq: Four times a day (QID) | RESPIRATORY_TRACT | 0 refills | Status: DC | PRN

## 2020-04-15 MED ORDER — CEFTRIAXONE SODIUM 500 MG IJ SOLR
INTRAMUSCULAR | Status: AC
  Filled 2020-04-15: qty 500

## 2020-04-15 NOTE — Discharge Instructions (Signed)
I refilled your albuterol inhaler, continue to use as needed for shortness of breath/wheezing Begin prednisone daily for the next 5 days with food to help with asthma  We have treated you today for gonorrhea, with rocephin.  Begin doxycycline twice daily x1 week to cover chlamydia.  Please refrain from sexual activity for 7 days while medicine is clearing infection.  We are testing you for Gonorrhea, Chlamydia and Trichomonas. We will call you if anything is positive and let you know if you require any further treatment. Please inform partner of any positive results.  Please return if symptoms not improving with treatment, development of fever, nausea, vomiting, abdominal pain, scrotal pain.
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
## 2020-04-15 NOTE — ED Triage Notes (Signed)
Pt c/o needing a refill on albuterol inhaler. He states he accidentally dropped his last one in the toilet. Pt would like STD check due to some penile discharge of yellowish color. Pt is not in any respiratory distress.

## 2020-04-18 LAB — CYTOLOGY, (ORAL, ANAL, URETHRAL) ANCILLARY ONLY
Chlamydia: POSITIVE — AB
Comment: NEGATIVE
Comment: NEGATIVE
Comment: NORMAL
Neisseria Gonorrhea: NEGATIVE
Trichomonas: NEGATIVE

## 2020-07-24 ENCOUNTER — Ambulatory Visit (INDEPENDENT_AMBULATORY_CARE_PROVIDER_SITE_OTHER): Payer: Medicare Other

## 2020-07-24 ENCOUNTER — Other Ambulatory Visit: Payer: Self-pay

## 2020-07-24 ENCOUNTER — Encounter (HOSPITAL_COMMUNITY): Payer: Self-pay

## 2020-07-24 ENCOUNTER — Ambulatory Visit (HOSPITAL_COMMUNITY)
Admission: EM | Admit: 2020-07-24 | Discharge: 2020-07-24 | Disposition: A | Discharge status: Home / Self Care | Payer: Medicare Other | Attending: Family Medicine | Admitting: Family Medicine

## 2020-07-24 DIAGNOSIS — M25512 Pain in left shoulder: Secondary | ICD-10-CM

## 2020-07-24 DIAGNOSIS — R079 Chest pain, unspecified: Secondary | ICD-10-CM | POA: Diagnosis not present

## 2020-07-24 DIAGNOSIS — S29011A Strain of muscle and tendon of front wall of thorax, initial encounter: Secondary | ICD-10-CM

## 2020-07-24 MED ORDER — ALBUTEROL SULFATE HFA 108 (90 BASE) MCG/ACT IN AERS: 1.0000 | INHALATION_SPRAY | Freq: Four times a day (QID) | RESPIRATORY_TRACT | 0 refills | Status: DC | PRN

## 2020-07-24 MED ORDER — TIZANIDINE HCL 4 MG PO TABS
4.0000 mg | ORAL_TABLET | Freq: Four times a day (QID) | ORAL | 0 refills | Status: DC | PRN
Start: 2020-07-24 — End: 2021-02-12

## 2020-07-24 MED ORDER — NAPROXEN 500 MG PO TABS
500.0000 mg | ORAL_TABLET | Freq: Two times a day (BID) | ORAL | 0 refills | Status: DC
Start: 2020-07-24 — End: 2021-02-12

## 2020-07-24 NOTE — ED Triage Notes (Signed)
Pt present chest, back and leg pain. Symptom started this am. Pt states the chest pain starts on the left side of his chest and hurts when he lifts up his left arm.  Then radiant's down his back.

## 2020-07-24 NOTE — Discharge Instructions (Addendum)
Treating for acute muscle strain. Have prescribed naproxen 500 mg to take twice daily as needed for pain. For shoulder pain have also prescribed you tizanidine 4 mg at bedtime as needed. Your EKG is reassuring today and does not show any acute reason for the chest pain. Chest pain is likely rate related to chest wall pain stemming from your shoulder pain. Recommend ER evaluation if symptoms worsen or do not improve with prescribed treatment. Also follow-up with primary care as needed.
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
## 2020-08-18 ENCOUNTER — Emergency Department (HOSPITAL_COMMUNITY): Payer: Medicare Other

## 2020-08-18 ENCOUNTER — Other Ambulatory Visit: Payer: Self-pay

## 2020-08-18 ENCOUNTER — Encounter (HOSPITAL_COMMUNITY): Payer: Self-pay

## 2020-08-18 ENCOUNTER — Emergency Department (HOSPITAL_COMMUNITY)
Admission: EM | Admit: 2020-08-18 | Discharge: 2020-08-18 | Disposition: A | Discharge status: Home / Self Care | Payer: Medicare Other | Attending: Emergency Medicine | Admitting: Emergency Medicine

## 2020-08-18 DIAGNOSIS — F17210 Nicotine dependence, cigarettes, uncomplicated: Secondary | ICD-10-CM | POA: Insufficient documentation

## 2020-08-18 DIAGNOSIS — J020 Streptococcal pharyngitis: Secondary | ICD-10-CM | POA: Diagnosis not present

## 2020-08-18 DIAGNOSIS — J45909 Unspecified asthma, uncomplicated: Secondary | ICD-10-CM | POA: Diagnosis not present

## 2020-08-18 DIAGNOSIS — Z20822 Contact with and (suspected) exposure to covid-19: Secondary | ICD-10-CM | POA: Insufficient documentation

## 2020-08-18 DIAGNOSIS — R070 Pain in throat: Secondary | ICD-10-CM | POA: Diagnosis present

## 2020-08-18 DIAGNOSIS — J029 Acute pharyngitis, unspecified: Secondary | ICD-10-CM | POA: Diagnosis not present

## 2020-08-18 LAB — RESP PANEL BY RT-PCR (FLU A&B, COVID) ARPGX2
Influenza A by PCR: NEGATIVE
Influenza B by PCR: NEGATIVE
SARS Coronavirus 2 by RT PCR: NEGATIVE

## 2020-08-18 LAB — GROUP A STREP BY PCR: Group A Strep by PCR: DETECTED — AB

## 2020-08-18 MED ORDER — PENICILLIN G BENZATHINE 1200000 UNIT/2ML IM SUSP
1.2000 10*6.[IU] | Freq: Once | INTRAMUSCULAR | Status: AC
  Administered 2020-08-18: 1.2 10*6.[IU] via INTRAMUSCULAR
  Filled 2020-08-18: qty 2

## 2020-08-18 MED ORDER — LIDOCAINE VISCOUS HCL 2 % MT SOLN
15.0000 mL | Freq: Once | OROMUCOSAL | Status: AC
  Administered 2020-08-18: 15 mL via OROMUCOSAL
  Filled 2020-08-18: qty 15

## 2020-08-18 MED ORDER — IBUPROFEN 400 MG PO TABS
600.0000 mg | ORAL_TABLET | Freq: Once | ORAL | Status: AC
  Administered 2020-08-18: 600 mg via ORAL
  Filled 2020-08-18: qty 1

## 2020-08-18 MED ORDER — DEXAMETHASONE SODIUM PHOSPHATE 10 MG/ML IJ SOLN
10.0000 mg | Freq: Once | INTRAMUSCULAR | Status: AC
  Administered 2020-08-18: 10 mg via INTRAMUSCULAR
  Filled 2020-08-18: qty 1
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
## 2020-08-18 NOTE — ED Triage Notes (Signed)
Pt arrives to ED w/ c/o generalized body aches and sore throat that started yesterday. Pt denies cough, fever, sob.

## 2020-08-18 NOTE — Discharge Instructions (Signed)
You were treated today with antibiotics for strep throat, this should start to improve over the next 48 hours.  You may use ibuprofen and Tylenol every 6 hours for pain as well as Cepacol throat lozenges.  Make sure you are drinking plenty of fluids.  Return for worsening throat pain, fevers, difficulty swallowing or breathing or any other new or concerning symptoms.  Your Covid and flu test were negative today.

## 2020-09-11 ENCOUNTER — Other Ambulatory Visit: Payer: Self-pay | Admitting: Family Medicine

## 2020-09-11 NOTE — Telephone Encounter (Signed)
Forwarding to Joaquin Courts FNP- pt does not have PCP.

## 2020-09-27 ENCOUNTER — Ambulatory Visit (HOSPITAL_COMMUNITY)
Admission: EM | Admit: 2020-09-27 | Discharge: 2020-09-27 | Disposition: A | Discharge status: Home / Self Care | Payer: Medicare Other

## 2020-09-28 ENCOUNTER — Ambulatory Visit (HOSPITAL_COMMUNITY)
Admission: EM | Admit: 2020-09-28 | Discharge: 2020-09-28 | Disposition: A | Discharge status: Home / Self Care | Payer: Medicare Other | Attending: Internal Medicine | Admitting: Internal Medicine

## 2020-09-28 ENCOUNTER — Other Ambulatory Visit: Payer: Self-pay

## 2020-09-28 ENCOUNTER — Encounter (HOSPITAL_COMMUNITY): Payer: Self-pay | Admitting: Emergency Medicine

## 2020-09-28 DIAGNOSIS — U071 COVID-19: Secondary | ICD-10-CM | POA: Diagnosis not present

## 2020-09-28 DIAGNOSIS — B349 Viral infection, unspecified: Secondary | ICD-10-CM | POA: Diagnosis not present

## 2020-09-28 LAB — SARS CORONAVIRUS 2 (TAT 6-24 HRS): SARS Coronavirus 2: POSITIVE — AB

## 2020-09-28 MED ORDER — ONDANSETRON HCL 4 MG PO TABS: 4.0000 mg | ORAL_TABLET | Freq: Every day | ORAL | 1 refills | Status: DC | PRN
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
## 2020-09-28 NOTE — Discharge Instructions (Signed)
It appears you have a viral illness.  I am testing today for COVID-19.  You will need to isolate until test results are available.  If your Covid test positive you will need to isolate for 5 days since symptom onset or up to 10 days if continued fever.  Make sure you are drinking plenty of fluid.  I prescribed some medication for nausea.  Please take only as needed.  Return or go to the emergency department for worsening symptoms  COVID testing ordered. It will take between 3-7 days for test results. Someone will contact you regarding abnormal results or you can access your results through MyChart.   In the meantime you should... Remain isolated in your home for 5 days from symptom onset AND greater than 48 hours of no fever without the use of fever-reducing medication Get plenty of rest and fluids Flonase for nasal congestion and/or runny nose You can take OTC Zyrtec-D for nasal congestion, runny nose, and/or sore throat Use these medications as directed for symptom relief Use Tylenol or Ibuprofen as needed for fever or pain Return or go to the ER for any worsening or new symptoms such as high fever, worsening cough, shortness of breath, chest tightness, chest pain, changes in mental status, etc.

## 2020-09-28 NOTE — ED Triage Notes (Signed)
Patient has body aches, diarrhea, and rarely gagging.  -symptoms started tuesday

## 2021-02-08 DIAGNOSIS — M549 Dorsalgia, unspecified: Secondary | ICD-10-CM | POA: Diagnosis not present

## 2021-02-08 DIAGNOSIS — M62830 Muscle spasm of back: Secondary | ICD-10-CM | POA: Diagnosis not present

## 2021-02-08 DIAGNOSIS — F17200 Nicotine dependence, unspecified, uncomplicated: Secondary | ICD-10-CM | POA: Diagnosis not present

## 2021-02-08 DIAGNOSIS — W010XXA Fall on same level from slipping, tripping and stumbling without subsequent striking against object, initial encounter: Secondary | ICD-10-CM | POA: Diagnosis not present

## 2021-02-12 ENCOUNTER — Emergency Department (HOSPITAL_BASED_OUTPATIENT_CLINIC_OR_DEPARTMENT_OTHER)
Admission: EM | Admit: 2021-02-12 | Discharge: 2021-02-12 | Disposition: A | Discharge status: Home / Self Care | Payer: Medicare Other | Attending: Emergency Medicine | Admitting: Emergency Medicine

## 2021-02-12 ENCOUNTER — Encounter (HOSPITAL_BASED_OUTPATIENT_CLINIC_OR_DEPARTMENT_OTHER): Payer: Self-pay | Admitting: *Deleted

## 2021-02-12 ENCOUNTER — Other Ambulatory Visit: Payer: Self-pay

## 2021-02-12 DIAGNOSIS — R21 Rash and other nonspecific skin eruption: Secondary | ICD-10-CM

## 2021-02-12 DIAGNOSIS — M545 Low back pain, unspecified: Secondary | ICD-10-CM | POA: Diagnosis not present

## 2021-02-12 DIAGNOSIS — S060X9A Concussion with loss of consciousness of unspecified duration, initial encounter: Secondary | ICD-10-CM

## 2021-02-12 DIAGNOSIS — W08XXXA Fall from other furniture, initial encounter: Secondary | ICD-10-CM | POA: Insufficient documentation

## 2021-02-12 DIAGNOSIS — F17210 Nicotine dependence, cigarettes, uncomplicated: Secondary | ICD-10-CM | POA: Diagnosis not present

## 2021-02-12 DIAGNOSIS — M542 Cervicalgia: Secondary | ICD-10-CM | POA: Insufficient documentation

## 2021-02-12 DIAGNOSIS — S0990XA Unspecified injury of head, initial encounter: Secondary | ICD-10-CM | POA: Diagnosis present

## 2021-02-12 DIAGNOSIS — J45909 Unspecified asthma, uncomplicated: Secondary | ICD-10-CM | POA: Insufficient documentation

## 2021-02-12 DIAGNOSIS — S060X1A Concussion with loss of consciousness of 30 minutes or less, initial encounter: Secondary | ICD-10-CM | POA: Diagnosis not present

## 2021-02-12 DIAGNOSIS — S060X0A Concussion without loss of consciousness, initial encounter: Secondary | ICD-10-CM | POA: Diagnosis not present

## 2021-02-12 MED ORDER — METOCLOPRAMIDE HCL 5 MG/ML IJ SOLN
10.0000 mg | Freq: Once | INTRAMUSCULAR | Status: AC
  Administered 2021-02-12: 10 mg via INTRAVENOUS
  Filled 2021-02-12: qty 2

## 2021-02-12 MED ORDER — KETOROLAC TROMETHAMINE 30 MG/ML IJ SOLN
30.0000 mg | Freq: Once | INTRAMUSCULAR | Status: AC
  Administered 2021-02-12: 30 mg via INTRAVENOUS
  Filled 2021-02-12: qty 1

## 2021-02-12 MED ORDER — DIPHENHYDRAMINE HCL 50 MG/ML IJ SOLN
25.0000 mg | Freq: Once | INTRAMUSCULAR | Status: AC
  Administered 2021-02-12: 25 mg via INTRAVENOUS
  Filled 2021-02-12: qty 1

## 2021-02-12 NOTE — ED Triage Notes (Addendum)
Pt states that a step stool he was standing on broke on May 17th,  while at work causing him to fall. States he "hit his head"  And was seen at [facility] ER. Pt c/o continued h/a and light sensitivity.  states he had a "slight concussion" with initial fall. C/o pain to left ribcage area. States he did have xrays done. Also, concerned about a rash on his right hand.
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
## 2021-04-19 DIAGNOSIS — Z20822 Contact with and (suspected) exposure to covid-19: Secondary | ICD-10-CM | POA: Diagnosis not present

## 2021-06-25 ENCOUNTER — Other Ambulatory Visit: Payer: Self-pay | Admitting: Family Medicine

## 2021-07-04 ENCOUNTER — Emergency Department (HOSPITAL_BASED_OUTPATIENT_CLINIC_OR_DEPARTMENT_OTHER)
Admission: EM | Admit: 2021-07-04 | Discharge: 2021-07-04 | Disposition: A | Discharge status: Home / Self Care | Payer: Medicare Other | Attending: Emergency Medicine | Admitting: Emergency Medicine

## 2021-07-04 ENCOUNTER — Other Ambulatory Visit: Payer: Self-pay

## 2021-07-04 ENCOUNTER — Encounter (HOSPITAL_BASED_OUTPATIENT_CLINIC_OR_DEPARTMENT_OTHER): Payer: Self-pay

## 2021-07-04 DIAGNOSIS — Z5321 Procedure and treatment not carried out due to patient leaving prior to being seen by health care provider: Secondary | ICD-10-CM | POA: Diagnosis not present

## 2021-07-04 DIAGNOSIS — R109 Unspecified abdominal pain: Secondary | ICD-10-CM | POA: Diagnosis not present

## 2021-07-04 LAB — CBC WITH DIFFERENTIAL/PLATELET
Abs Immature Granulocytes: 0.02 10*3/uL (ref 0.00–0.07)
Basophils Absolute: 0.1 10*3/uL (ref 0.0–0.1)
Basophils Relative: 1 %
Eosinophils Absolute: 0.2 10*3/uL (ref 0.0–0.5)
Eosinophils Relative: 1 %
HCT: 46.8 % (ref 39.0–52.0)
Hemoglobin: 15.7 g/dL (ref 13.0–17.0)
Immature Granulocytes: 0 %
Lymphocytes Relative: 33 %
Lymphs Abs: 3.6 10*3/uL (ref 0.7–4.0)
MCH: 29.2 pg (ref 26.0–34.0)
MCHC: 33.5 g/dL (ref 30.0–36.0)
MCV: 87.2 fL (ref 80.0–100.0)
Monocytes Absolute: 0.7 10*3/uL (ref 0.1–1.0)
Monocytes Relative: 7 %
Neutro Abs: 6.3 10*3/uL (ref 1.7–7.7)
Neutrophils Relative %: 58 %
Platelets: 282 10*3/uL (ref 150–400)
RBC: 5.37 MIL/uL (ref 4.22–5.81)
RDW: 12.8 % (ref 11.5–15.5)
WBC: 10.9 10*3/uL — ABNORMAL HIGH (ref 4.0–10.5)
nRBC: 0 % (ref 0.0–0.2)

## 2021-07-04 LAB — COMPREHENSIVE METABOLIC PANEL
ALT: 9 U/L (ref 0–44)
AST: 13 U/L — ABNORMAL LOW (ref 15–41)
Albumin: 4.5 g/dL (ref 3.5–5.0)
Alkaline Phosphatase: 54 U/L (ref 38–126)
Anion gap: 8 (ref 5–15)
BUN: 10 mg/dL (ref 6–20)
CO2: 30 mmol/L (ref 22–32)
Calcium: 9.4 mg/dL (ref 8.9–10.3)
Chloride: 103 mmol/L (ref 98–111)
Creatinine, Ser: 0.86 mg/dL (ref 0.61–1.24)
GFR, Estimated: >60 mL/min (ref >60)
Glucose, Bld: 98 mg/dL (ref 70–99)
Potassium: 4.1 mmol/L (ref 3.5–5.1)
Sodium: 141 mmol/L (ref 135–145)
Total Bilirubin: 0.4 mg/dL (ref 0.3–1.2)
Total Protein: 7.2 g/dL (ref 6.5–8.1)

## 2021-07-04 LAB — LIPASE, BLOOD: Lipase: 17 U/L (ref 11–51)

## 2021-07-04 MED ORDER — OXYCODONE-ACETAMINOPHEN 5-325 MG PO TABS
1.0000 | ORAL_TABLET | Freq: Once | ORAL | Status: AC
  Administered 2021-07-04: 1 via ORAL
  Filled 2021-07-04: qty 1

## 2021-07-04 NOTE — ED Triage Notes (Signed)
Pt reports abd pain started today . Denies urinary/ fever

## 2021-07-04 NOTE — ED Notes (Signed)
Patient states he would live to LWBS by Provider. This RN removed this patients PIV. Patient ambulatory upon Departure.

## 2021-10-10 ENCOUNTER — Encounter (HOSPITAL_COMMUNITY): Payer: Self-pay | Admitting: Emergency Medicine

## 2021-10-10 ENCOUNTER — Other Ambulatory Visit: Payer: Self-pay

## 2021-10-10 ENCOUNTER — Ambulatory Visit (HOSPITAL_COMMUNITY)
Admission: EM | Admit: 2021-10-10 | Discharge: 2021-10-10 | Disposition: A | Discharge status: Home / Self Care | Payer: Medicare Other | Attending: Urgent Care | Admitting: Urgent Care

## 2021-10-10 DIAGNOSIS — Z20822 Contact with and (suspected) exposure to covid-19: Secondary | ICD-10-CM | POA: Diagnosis not present

## 2021-10-10 DIAGNOSIS — Z8616 Personal history of COVID-19: Secondary | ICD-10-CM | POA: Insufficient documentation

## 2021-10-10 DIAGNOSIS — R0789 Other chest pain: Secondary | ICD-10-CM | POA: Diagnosis not present

## 2021-10-10 DIAGNOSIS — F17200 Nicotine dependence, unspecified, uncomplicated: Secondary | ICD-10-CM

## 2021-10-10 DIAGNOSIS — R051 Acute cough: Secondary | ICD-10-CM | POA: Insufficient documentation

## 2021-10-10 DIAGNOSIS — B349 Viral infection, unspecified: Secondary | ICD-10-CM | POA: Insufficient documentation

## 2021-10-10 DIAGNOSIS — F1290 Cannabis use, unspecified, uncomplicated: Secondary | ICD-10-CM | POA: Insufficient documentation

## 2021-10-10 DIAGNOSIS — J45909 Unspecified asthma, uncomplicated: Secondary | ICD-10-CM | POA: Insufficient documentation

## 2021-10-10 DIAGNOSIS — R52 Pain, unspecified: Secondary | ICD-10-CM

## 2021-10-10 DIAGNOSIS — F17210 Nicotine dependence, cigarettes, uncomplicated: Secondary | ICD-10-CM | POA: Diagnosis not present

## 2021-10-10 LAB — SARS CORONAVIRUS 2 (TAT 6-24 HRS): SARS Coronavirus 2: NEGATIVE

## 2021-10-10 LAB — POC INFLUENZA A AND B ANTIGEN (URGENT CARE ONLY)
INFLUENZA A ANTIGEN, POC: NEGATIVE
INFLUENZA B ANTIGEN, POC: NEGATIVE

## 2021-10-10 MED ORDER — BENZONATATE 100 MG PO CAPS: 100.0000 mg | ORAL_CAPSULE | Freq: Three times a day (TID) | ORAL | 0 refills | Status: AC | PRN

## 2021-10-10 MED ORDER — PROMETHAZINE-DM 6.25-15 MG/5ML PO SYRP: 5.0000 mL | ORAL_SOLUTION | Freq: Every evening | ORAL | 0 refills | Status: AC | PRN

## 2021-10-10 MED ORDER — ALBUTEROL SULFATE HFA 108 (90 BASE) MCG/ACT IN AERS: 1.0000 | INHALATION_SPRAY | Freq: Four times a day (QID) | RESPIRATORY_TRACT | 0 refills | Status: AC | PRN

## 2021-10-10 MED ORDER — PREDNISONE 50 MG PO TABS: 50.0000 mg | ORAL_TABLET | Freq: Every day | ORAL | 0 refills | Status: DC
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
## 2021-10-10 NOTE — ED Triage Notes (Signed)
Since yesterday having body aches, cough, sore throat, joint pains and fever last night. Had vomiting and diarrhea last night.

## 2021-10-10 NOTE — ED Notes (Signed)
Pt came out of room asking if he can leave. Mani PA spoke with patient, making him aware that patient can leave or if he can wait few more minutes he could get his discharge instructions together for him. Pt stated that he wants to be call with results upon leaving.

## 2022-01-26 ENCOUNTER — Encounter (HOSPITAL_COMMUNITY): Payer: Self-pay | Admitting: Emergency Medicine

## 2022-01-26 ENCOUNTER — Emergency Department (HOSPITAL_COMMUNITY)
Admission: EM | Admit: 2022-01-26 | Discharge: 2022-01-26 | Disposition: A | Discharge status: Home / Self Care | Payer: Medicare Other | Attending: Emergency Medicine | Admitting: Emergency Medicine

## 2022-01-26 DIAGNOSIS — M7021 Olecranon bursitis, right elbow: Secondary | ICD-10-CM | POA: Insufficient documentation

## 2022-01-26 DIAGNOSIS — M7031 Other bursitis of elbow, right elbow: Secondary | ICD-10-CM | POA: Diagnosis not present

## 2022-01-26 DIAGNOSIS — Y9389 Activity, other specified: Secondary | ICD-10-CM | POA: Diagnosis not present

## 2022-01-26 DIAGNOSIS — J039 Acute tonsillitis, unspecified: Secondary | ICD-10-CM | POA: Insufficient documentation

## 2022-01-26 DIAGNOSIS — M7022 Olecranon bursitis, left elbow: Secondary | ICD-10-CM | POA: Diagnosis not present

## 2022-01-26 DIAGNOSIS — M25521 Pain in right elbow: Secondary | ICD-10-CM | POA: Diagnosis present

## 2022-01-26 MED ORDER — PREDNISONE 20 MG PO TABS
60.0000 mg | ORAL_TABLET | Freq: Once | ORAL | Status: AC
  Administered 2022-01-26: 60 mg via ORAL
  Filled 2022-01-26: qty 3

## 2022-01-26 MED ORDER — AMOXICILLIN 500 MG PO CAPS: 500.0000 mg | ORAL_CAPSULE | Freq: Three times a day (TID) | ORAL | 0 refills | Status: AC

## 2022-01-26 MED ORDER — PREDNISONE 20 MG PO TABS: 20.0000 mg | ORAL_TABLET | Freq: Two times a day (BID) | ORAL | 0 refills | Status: AC

## 2022-01-26 NOTE — ED Provider Notes (Signed)
?[facility]-EMERGENCY DEPT ?Provider Note ? ? ?CSN: [id number] ?Arrival date & time: 01/26/22  1028 ? ?  ? ?History ? ?No chief complaint on file. ? ? ?Colin Hammond is a 36 y.o. male. ? ?HPI ?He presents for evaluation of 2 different problems.  He has soreness of both elbows, without known trauma.  He does admit that sometimes he falls asleep, landing on his right elbow.  He has a secondary complaint of sore knots on his neck beneath his jaw bilaterally.  He also complains of sore throat.  He denies fever, chills, cough, shortness of breath, weakness or dizziness.  He has not had these problems previously. ?  ? ?Home Medications ?Prior to Admission medications   ?Medication Sig Start Date End Date Taking? Authorizing Provider  ?acetaminophen (TYLENOL) 500 MG tablet Take 1 tablet (500 mg total) by mouth every 6 (six) hours as needed. 12/10/18   Fawze, Mina A, PA-C  ?albuterol (VENTOLIN HFA) 108 (90 Base) MCG/ACT inhaler Inhale 1-2 puffs into the lungs every 6 (six) hours as needed for wheezing or shortness of breath. 10/10/21   Wallis Bamberg, PA-C  ?benzonatate (TESSALON) 100 MG capsule Take 1-2 capsules (100-200 mg total) by mouth 3 (three) times daily as needed for cough. 10/10/21   Wallis Bamberg, PA-C  ?predniSONE (DELTASONE) 50 MG tablet Take 1 tablet (50 mg total) by mouth daily with breakfast. 10/10/21   Wallis Bamberg, PA-C  ?promethazine-dextromethorphan (PROMETHAZINE-DM) 6.25-15 MG/5ML syrup Take 5 mLs by mouth at bedtime as needed for cough. 10/10/21   Wallis Bamberg, PA-C  ?   ? ?Allergies    ?Kadian [morphine sulfate er] and Lyrica [pregabalin]   ? ?Review of Systems   ?Review of Systems ? ?Physical Exam ?Updated Vital Signs ?BP 120/74 (BP Location: Left Arm)   Pulse 97   Temp 98.3 ?F (36.8 ?C) (Oral)   Resp 16   SpO2 97%  ?Physical Exam ?Vitals and nursing note reviewed.  ?Constitutional:   ?   General: He is not in acute distress. ?   Appearance: He is well-developed. He is not ill-appearing.   ?HENT:  ?   Head: Normocephalic and atraumatic.  ?   Right Ear: External ear normal.  ?   Left Ear: External ear normal.  ?   Mouth/Throat:  ?   Comments: Mild bilateral exudative tonsillitis.  No trismus.  No oropharyngeal swelling. ?Eyes:  ?   Conjunctiva/sclera: Conjunctivae normal.  ?   Pupils: Pupils are equal, round, and reactive to light.  ?Neck:  ?   Trachea: Phonation normal.  ?Cardiovascular:  ?   Rate and Rhythm: Normal rate.  ?Pulmonary:  ?   Effort: Pulmonary effort is normal.  ?Abdominal:  ?   General: There is no distension.  ?Musculoskeletal:     ?   General: Normal range of motion.  ?   Cervical back: Normal range of motion and neck supple.  ?   Comments: Both elbows with palpable mass consistent with mild bursitis, over the olecranon processes.  No altered range of motion.  No significant pain with movement of the elbows.  ?Skin: ?   General: Skin is warm and dry.  ?Neurological:  ?   Mental Status: He is alert and oriented to person, place, and time.  ?   Cranial Nerves: No cranial nerve deficit.  ?   Sensory: No sensory deficit.  ?   Motor: No abnormal muscle tone.  ?   Coordination: Coordination normal.  ?Psychiatric:     ?  Mood and Affect: Mood normal.     ?   Behavior: Behavior normal.     ?   Thought Content: Thought content normal.     ?   Judgment: Judgment normal.  ? ? ?ED Results / Procedures / Treatments   ?Labs ?(all labs ordered are listed, but only abnormal results are displayed) ?Labs Reviewed - No data to display ? ?EKG ?None ? ?Radiology ?No results found. ? ?Procedures ?Procedures  ? ? ?Medications Ordered in ED ?Medications - No data to display ? ?ED Course/ Medical Decision Making/ A&P ?  ?                        ?Medical Decision Making ?Patient presenting with atraumatic elbow pain, and sore throat with lymphadenopathy.  No apparent systemic illnesses. ? ?Problems Addressed: ?Olecranon bursitis of both elbows: acute illness or injury ?   Details: Unclear  etiology ?Tonsillitis: acute illness or injury ? ?Amount and/or Complexity of Data Reviewed ?Independent Historian:  ?   Details: He has a cogent historian ? ?Risk ?OTC drugs. ?Prescription drug management. ?Risk Details: Patient with bilateral elbow pain without trauma.  Appears to have olecranon bursitis.  Doubt septic arthritis.  Secondary complaint of nodules of neck which are consistent with oropharyngeal source/tonsillitis.  Doubt deep tissue infection.  No evidence for systemic illness.  Does not appear to be gonococcal.  Will prescribe prednisone for bursitis and amoxicillin for tonsillitis.  No indication for hospitalization at this time. ? ? ? ? ? ? ? ? ? ? ?Final Clinical Impression(s) / ED Diagnoses ?Final diagnoses:  ?None  ? ? ?Rx / DC Orders ?ED Discharge Orders   ? ? None  ? ?  ? ? ?  ?Mancel Bale, MD ?01/26/22 1116 ? ?
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
## 2022-01-26 NOTE — Discharge Instructions (Signed)
The elbow discomfort is due to bursitis.  We are prescribing prednisone to help decrease the inflammation.  Try using heat on the sore area and avoid putting pressure on the elbow to decrease the swelling and discomfort.  We are prescribing amoxicillin to treat a tonsil infection.  For pain control you can also use Tylenol 650 mg every 4 hours.  See the doctor of your choice as needed for problems. ?

## 2022-09-06 DIAGNOSIS — Z20822 Contact with and (suspected) exposure to covid-19: Secondary | ICD-10-CM | POA: Diagnosis not present

## 2022-09-06 DIAGNOSIS — J101 Influenza due to other identified influenza virus with other respiratory manifestations: Secondary | ICD-10-CM | POA: Diagnosis not present

## 2022-09-06 DIAGNOSIS — R059 Cough, unspecified: Secondary | ICD-10-CM | POA: Diagnosis not present

## 2022-09-06 DIAGNOSIS — R197 Diarrhea, unspecified: Secondary | ICD-10-CM | POA: Diagnosis not present

## 2022-09-06 DIAGNOSIS — R599 Enlarged lymph nodes, unspecified: Secondary | ICD-10-CM | POA: Diagnosis not present
